# Patient Record
Sex: Female | Born: 1980 | Race: Black or African American | Hispanic: No | Smoking: Former smoker
Health system: Southern US, Community
[De-identification: ages and names within clinical notes are randomized; demographics above are authoritative.]

## PROBLEM LIST (undated history)

## (undated) ENCOUNTER — Encounter
Attending: Student in an Organized Health Care Education/Training Program | Primary: Student in an Organized Health Care Education/Training Program

## (undated) ENCOUNTER — Ambulatory Visit

## (undated) ENCOUNTER — Ambulatory Visit: Payer: BLUE CROSS/BLUE SHIELD

## (undated) ENCOUNTER — Encounter

## (undated) ENCOUNTER — Encounter: Attending: Obstetrics & Gynecology | Primary: Obstetrics & Gynecology

## (undated) ENCOUNTER — Telehealth

## (undated) ENCOUNTER — Encounter
Attending: Rehabilitative and Restorative Service Providers" | Primary: Rehabilitative and Restorative Service Providers"

## (undated) ENCOUNTER — Telehealth: Attending: Internal Medicine | Primary: Internal Medicine

## (undated) ENCOUNTER — Encounter: Attending: Advanced Practice Midwife | Primary: Advanced Practice Midwife

## (undated) ENCOUNTER — Encounter: Attending: Pulmonary Disease | Primary: Pulmonary Disease

## (undated) ENCOUNTER — Telehealth: Attending: Obstetrics & Gynecology | Primary: Obstetrics & Gynecology

## (undated) ENCOUNTER — Encounter: Payer: BLUE CROSS/BLUE SHIELD | Attending: Obstetrics & Gynecology | Primary: Obstetrics & Gynecology

## (undated) ENCOUNTER — Ambulatory Visit: Payer: PRIVATE HEALTH INSURANCE

## (undated) ENCOUNTER — Telehealth: Attending: Advanced Practice Midwife | Primary: Advanced Practice Midwife

## (undated) ENCOUNTER — Ambulatory Visit: Attending: Pharmacist | Primary: Pharmacist

## (undated) ENCOUNTER — Encounter
Payer: BLUE CROSS/BLUE SHIELD | Attending: Rehabilitative and Restorative Service Providers" | Primary: Rehabilitative and Restorative Service Providers"

## (undated) ENCOUNTER — Ambulatory Visit: Payer: BLUE CROSS/BLUE SHIELD | Attending: Advanced Practice Midwife | Primary: Advanced Practice Midwife

## (undated) ENCOUNTER — Non-Acute Institutional Stay: Payer: BLUE CROSS/BLUE SHIELD

## (undated) ENCOUNTER — Telehealth
Attending: Student in an Organized Health Care Education/Training Program | Primary: Student in an Organized Health Care Education/Training Program

## (undated) ENCOUNTER — Encounter: Payer: BLUE CROSS/BLUE SHIELD | Attending: Internal Medicine | Primary: Internal Medicine

## (undated) ENCOUNTER — Ambulatory Visit
Payer: PRIVATE HEALTH INSURANCE | Attending: Student in an Organized Health Care Education/Training Program | Primary: Student in an Organized Health Care Education/Training Program

## (undated) ENCOUNTER — Encounter: Payer: BLUE CROSS/BLUE SHIELD | Attending: Advanced Practice Midwife | Primary: Advanced Practice Midwife

## (undated) ENCOUNTER — Ambulatory Visit
Payer: BLUE CROSS/BLUE SHIELD | Attending: Rehabilitative and Restorative Service Providers" | Primary: Rehabilitative and Restorative Service Providers"

## (undated) ENCOUNTER — Telehealth: Attending: Pulmonary Disease | Primary: Pulmonary Disease

## (undated) ENCOUNTER — Inpatient Hospital Stay

## (undated) DIAGNOSIS — J45909 Unspecified asthma, uncomplicated: Secondary | ICD-10-CM

## (undated) HISTORY — PX: HERNIA REPAIR: SHX51

## (undated) MED ORDER — NUCALA 100 MG/ML SUBCUTANEOUS AUTO-INJECTOR: 100 mg | mL | 11 refills | 28 days

---

## 2011-02-22 ENCOUNTER — Emergency Department: Payer: Self-pay | Admitting: Emergency Medicine

## 2011-06-19 ENCOUNTER — Emergency Department: Payer: Self-pay | Admitting: Emergency Medicine

## 2017-07-02 ENCOUNTER — Other Ambulatory Visit: Payer: Self-pay

## 2017-07-02 ENCOUNTER — Emergency Department
Admission: EM | Admit: 2017-07-02 | Discharge: 2017-07-02 | Disposition: A | Discharge status: Home / Self Care | Attending: Emergency Medicine | Admitting: Emergency Medicine

## 2017-07-02 ENCOUNTER — Emergency Department

## 2017-07-02 DIAGNOSIS — R079 Chest pain, unspecified: Secondary | ICD-10-CM | POA: Diagnosis present

## 2017-07-02 DIAGNOSIS — F17200 Nicotine dependence, unspecified, uncomplicated: Secondary | ICD-10-CM | POA: Insufficient documentation

## 2017-07-02 DIAGNOSIS — J40 Bronchitis, not specified as acute or chronic: Secondary | ICD-10-CM | POA: Diagnosis not present

## 2017-07-02 LAB — BASIC METABOLIC PANEL
ANION GAP: 9 (ref 5–15)
BUN: 12 mg/dL (ref 6–20)
CHLORIDE: 103 mmol/L (ref 101–111)
CO2: 24 mmol/L (ref 22–32)
Calcium: 9.7 mg/dL (ref 8.9–10.3)
Creatinine, Ser: 0.66 mg/dL (ref 0.44–1.00)
GFR calc non Af Amer: >60 mL/min (ref >60)
GLUCOSE: 134 mg/dL — AB (ref 65–99)
Potassium: 3.2 mmol/L — ABNORMAL LOW (ref 3.5–5.1)
Sodium: 136 mmol/L (ref 135–145)

## 2017-07-02 LAB — CBC
HCT: 34.5 % — ABNORMAL LOW (ref 35.0–47.0)
HEMOGLOBIN: 11.2 g/dL — AB (ref 12.0–16.0)
MCH: 28.6 pg (ref 26.0–34.0)
MCHC: 32.5 g/dL (ref 32.0–36.0)
MCV: 87.9 fL (ref 80.0–100.0)
Platelets: 270 10*3/uL (ref 150–440)
RBC: 3.92 MIL/uL (ref 3.80–5.20)
RDW: 16.3 % — ABNORMAL HIGH (ref 11.5–14.5)
WBC: 8 10*3/uL (ref 3.6–11.0)

## 2017-07-02 LAB — TROPONIN I: Troponin I: <0.03 ng/mL (ref <0.03)

## 2017-07-02 LAB — FIBRIN DERIVATIVES D-DIMER (ARMC ONLY): Fibrin derivatives D-dimer (ARMC): 174.18 ng/mL (FEU) (ref 0.00–499.00)

## 2017-07-02 MED ORDER — IPRATROPIUM-ALBUTEROL 0.5-2.5 (3) MG/3ML IN SOLN
RESPIRATORY_TRACT | Status: AC
  Filled 2017-07-02: qty 3

## 2017-07-02 MED ORDER — PREDNISONE 20 MG PO TABS
60.0000 mg | ORAL_TABLET | Freq: Once | ORAL | Status: AC
  Administered 2017-07-02: 60 mg via ORAL
  Filled 2017-07-02: qty 3

## 2017-07-02 MED ORDER — PREDNISONE 10 MG (21) PO TBPK: ORAL_TABLET | Freq: Every day | ORAL | 0 refills | Status: DC

## 2017-07-02 MED ORDER — IPRATROPIUM-ALBUTEROL 0.5-2.5 (3) MG/3ML IN SOLN
3.0000 mL | Freq: Once | RESPIRATORY_TRACT | Status: AC
  Administered 2017-07-02: 3 mL via RESPIRATORY_TRACT

## 2017-07-02 MED ORDER — ALBUTEROL SULFATE HFA 108 (90 BASE) MCG/ACT IN AERS: 2.0000 | INHALATION_SPRAY | Freq: Four times a day (QID) | RESPIRATORY_TRACT | 2 refills | Status: AC | PRN

## 2017-07-02 MED ORDER — IPRATROPIUM-ALBUTEROL 0.5-2.5 (3) MG/3ML IN SOLN
3.0000 mL | Freq: Once | RESPIRATORY_TRACT | Status: AC
  Administered 2017-07-02: 3 mL via RESPIRATORY_TRACT
  Filled 2017-07-02: qty 3

## 2017-07-02 NOTE — ED Triage Notes (Signed)
Pt arrives to ED c/o central CP that began last night with SOB. Pt has inhaler and has been using it since last night. States feels wheezy and shaky. Alert, oriented, ambulatory. No distress noted.

## 2017-07-02 NOTE — ED Provider Notes (Signed)
[facility] Emergency Department Provider Note       Time seen: ----------------------------------------- 5:30 PM on 07/02/2017 -----------------------------------------   I have reviewed the triage vital signs and the nursing notes.  HISTORY   Chief Complaint Chest Pain    HPI Laurie Briggs is a 37 y.o. female with no significant past medical history who presents to the ED for sudden central chest pain that started last night with shortness of breath.  Patient states she cannot take a deep breath.  She has an inhaler and has been using it since last night.  She states she feels wheezing and shaky.  She is never had this happen before, nothing makes it better.  History reviewed. No pertinent past medical history.  There are no active problems to display for this patient.   Past Surgical History:  Procedure Laterality Date  . HERNIA REPAIR      Allergies Patient has no known allergies.  Social History Social History   Tobacco Use  . Smoking status: Current Some Day Smoker  Substance Use Topics  . Alcohol use: Yes  . Drug use: Not on file   Review of Systems Constitutional: Negative for fever. Cardiovascular: Positive for chest pain Respiratory: Positive for difficulty breathing and pain with breathing Gastrointestinal: Negative for abdominal pain, vomiting and diarrhea. Musculoskeletal: Negative for back pain. Skin: Negative for rash. Neurological: Negative for headaches, focal weakness or numbness.  All systems negative/normal/unremarkable except as stated in the HPI  ____________________________________________   PHYSICAL EXAM:  VITAL SIGNS: ED Triage Vitals  Enc Vitals Group     BP 07/02/17 1630 136/83     Pulse Rate 07/02/17 1630 (!) 122     Resp 07/02/17 1630 20     Temp 07/02/17 1630 98.3 F (36.8 C)     Temp Source 07/02/17 1630 Oral     SpO2 07/02/17 1630 95 %     Weight 07/02/17 1625 125 lb (56.7 kg)     Height  07/02/17 1625 5\' 2"  (1.575 m)     Head Circumference --      Peak Flow --      Pain Score 07/02/17 1625 7     Pain Loc --      Pain Edu? --      Excl. in GC? --    Constitutional: Alert and oriented. Well appearing and in no distress. Eyes: Conjunctivae are normal. Normal extraocular movements. ENT   Head: Normocephalic and atraumatic.   Nose: No congestion/rhinnorhea.   Mouth/Throat: Mucous membranes are moist.   Neck: No stridor. Cardiovascular: Rapid rate, regular rhythm. No murmurs, rubs, or gallops. Respiratory: Normal respiratory effort without tachypnea nor retractions.  Rhonchi bilaterally Gastrointestinal: Soft and nontender. Normal bowel sounds Musculoskeletal: Nontender with normal range of motion in extremities. No lower extremity tenderness nor edema. Neurologic:  Normal speech and language. No gross focal neurologic deficits are appreciated.  Skin:  Skin is warm, dry and intact. No rash noted. Psychiatric: Mood and affect are normal. Speech and behavior are normal.  ____________________________________________  EKG: Interpreted by me.  Sinus tachycardia with a rate of 121 bpm, normal PR interval, normal QRS, normal QT.  ____________________________________________  ED COURSE:  As part of my medical decision making, I reviewed the following data within the electronic MEDICAL RECORD NUMBER History obtained from family if available, nursing notes, old chart and ekg, as well as notes from prior ED visits. Patient presented for pleuritic chest pain and wheezing, we will assess with labs  and imaging as indicated at this time.   Procedures ____________________________________________   LABS (pertinent positives/negatives)  Labs Reviewed  BASIC METABOLIC PANEL - Abnormal; Notable for the following components:      Result Value   Potassium 3.2 (*)    Glucose, Bld 134 (*)    All other components within normal limits  CBC - Abnormal; Notable for the following  components:   Hemoglobin 11.2 (*)    HCT 34.5 (*)    RDW 16.3 (*)    All other components within normal limits  TROPONIN I  FIBRIN DERIVATIVES D-DIMER ([facility] ONLY)  POC URINE PREG, ED    RADIOLOGY Images were viewed by me  Chest x-ray reveals bronchitic changes  ____________________________________________  DIFFERENTIAL DIAGNOSIS   Bronchitis, pneumonia, PE, musculoskeletal pain, URI  FINAL ASSESSMENT AND PLAN  Bronchitis   Plan: The patient had presented for pleuritic chest pain and wheezing. Patient's labs did not reveal any acute process, d-dimer was negative. Patient's imaging was more suggestive of bronchitis than any other etiology.  She will be discharged with albuterol and steroids.  She is cleared for outpatient follow-up.   Ulice Dash, MD   Note: This note was generated in part or whole with voice recognition software. Voice recognition is usually quite accurate but there are transcription errors that can and very often do occur. I apologize for any typographical errors that were not detected and corrected.     Emily Filbert, MD 07/02/17 [phone]

## 2017-07-30 ENCOUNTER — Other Ambulatory Visit: Payer: Self-pay

## 2017-07-30 ENCOUNTER — Emergency Department

## 2017-07-30 ENCOUNTER — Encounter: Payer: Self-pay | Admitting: Emergency Medicine

## 2017-07-30 DIAGNOSIS — A5901 Trichomonal vulvovaginitis: Secondary | ICD-10-CM | POA: Diagnosis present

## 2017-07-30 DIAGNOSIS — D649 Anemia, unspecified: Secondary | ICD-10-CM | POA: Diagnosis not present

## 2017-07-30 DIAGNOSIS — F17200 Nicotine dependence, unspecified, uncomplicated: Secondary | ICD-10-CM | POA: Diagnosis not present

## 2017-07-30 DIAGNOSIS — N730 Acute parametritis and pelvic cellulitis: Secondary | ICD-10-CM | POA: Diagnosis not present

## 2017-07-30 DIAGNOSIS — N7093 Salpingitis and oophoritis, unspecified: Secondary | ICD-10-CM | POA: Diagnosis present

## 2017-07-30 DIAGNOSIS — R109 Unspecified abdominal pain: Secondary | ICD-10-CM | POA: Diagnosis present

## 2017-07-30 LAB — POCT PREGNANCY, URINE: Preg Test, Ur: NEGATIVE

## 2017-07-30 LAB — URINALYSIS, COMPLETE (UACMP) WITH MICROSCOPIC
BACTERIA UA: NONE SEEN
BILIRUBIN URINE: NEGATIVE
Glucose, UA: NEGATIVE mg/dL
KETONES UR: 5 mg/dL — AB
LEUKOCYTES UA: NEGATIVE
Nitrite: NEGATIVE
Protein, ur: NEGATIVE mg/dL
SPECIFIC GRAVITY, URINE: 1.009 (ref 1.005–1.030)
pH: 8 (ref 5.0–8.0)

## 2017-07-30 LAB — CBC
HEMATOCRIT: 29 % — AB (ref 35.0–47.0)
Hemoglobin: 10.2 g/dL — ABNORMAL LOW (ref 12.0–16.0)
MCH: 30.2 pg (ref 26.0–34.0)
MCHC: 35.1 g/dL (ref 32.0–36.0)
MCV: 86 fL (ref 80.0–100.0)
PLATELETS: 282 10*3/uL (ref 150–440)
RBC: 3.38 MIL/uL — ABNORMAL LOW (ref 3.80–5.20)
RDW: 17.9 % — ABNORMAL HIGH (ref 11.5–14.5)
WBC: 10.9 10*3/uL (ref 3.6–11.0)

## 2017-07-30 LAB — COMPREHENSIVE METABOLIC PANEL
ALBUMIN: 3.9 g/dL (ref 3.5–5.0)
ALT: 15 U/L (ref 0–44)
ANION GAP: 9 (ref 5–15)
AST: 20 U/L (ref 15–41)
Alkaline Phosphatase: 49 U/L (ref 38–126)
BILIRUBIN TOTAL: 1.2 mg/dL (ref 0.3–1.2)
BUN: 6 mg/dL (ref 6–20)
CO2: 24 mmol/L (ref 22–32)
Calcium: 9.2 mg/dL (ref 8.9–10.3)
Chloride: 102 mmol/L (ref 98–111)
Creatinine, Ser: 0.63 mg/dL (ref 0.44–1.00)
GFR calc Af Amer: >60 mL/min (ref >60)
GLUCOSE: 110 mg/dL — AB (ref 70–99)
POTASSIUM: 3.3 mmol/L — AB (ref 3.5–5.1)
Sodium: 135 mmol/L (ref 135–145)
Total Protein: 8.2 g/dL — ABNORMAL HIGH (ref 6.5–8.1)

## 2017-07-30 LAB — LIPASE, BLOOD: Lipase: 23 U/L (ref 11–51)

## 2017-07-30 MED ORDER — SODIUM CHLORIDE 0.9 % IV BOLUS
1000.0000 mL | Freq: Once | INTRAVENOUS | Status: AC
  Administered 2017-07-30: 1000 mL via INTRAVENOUS

## 2017-07-30 MED ORDER — ONDANSETRON HCL 4 MG/2ML IJ SOLN
4.0000 mg | Freq: Once | INTRAMUSCULAR | Status: AC | PRN
  Administered 2017-07-30: 4 mg via INTRAVENOUS
  Filled 2017-07-30: qty 2

## 2017-07-30 MED ORDER — FENTANYL CITRATE (PF) 100 MCG/2ML IJ SOLN
50.0000 ug | INTRAMUSCULAR | Status: AC | PRN
  Administered 2017-07-30 – 2017-07-31 (×2): 50 ug via INTRAVENOUS
  Filled 2017-07-30 (×2): qty 2

## 2017-07-30 NOTE — ED Triage Notes (Signed)
Pt arrives ambulatory to ED with c/o abdominal pain x 2 days which radiates across her abdomen and into her back. Pt reports that she hurts more on the left side then the right at this time. Pt is in NAD.

## 2017-07-30 NOTE — ED Notes (Signed)
Returned from ultrasound.

## 2017-07-30 NOTE — ED Notes (Signed)
IV attempt x 1 by paramedic student Cheree DittoGraham without success.

## 2017-07-31 ENCOUNTER — Emergency Department

## 2017-07-31 ENCOUNTER — Inpatient Hospital Stay
Admission: EM | Admit: 2017-07-31 | Discharge: 2017-08-02 | DRG: 759 | Disposition: A | Discharge status: Home / Self Care | Attending: Obstetrics and Gynecology | Admitting: Obstetrics and Gynecology

## 2017-07-31 ENCOUNTER — Encounter: Payer: Self-pay | Admitting: Radiology

## 2017-07-31 DIAGNOSIS — F17200 Nicotine dependence, unspecified, uncomplicated: Secondary | ICD-10-CM | POA: Diagnosis present

## 2017-07-31 DIAGNOSIS — R110 Nausea: Secondary | ICD-10-CM

## 2017-07-31 DIAGNOSIS — R102 Pelvic and perineal pain: Secondary | ICD-10-CM

## 2017-07-31 DIAGNOSIS — N7093 Salpingitis and oophoritis, unspecified: Secondary | ICD-10-CM | POA: Diagnosis present

## 2017-07-31 DIAGNOSIS — R109 Unspecified abdominal pain: Secondary | ICD-10-CM | POA: Diagnosis present

## 2017-07-31 DIAGNOSIS — N730 Acute parametritis and pelvic cellulitis: Secondary | ICD-10-CM

## 2017-07-31 DIAGNOSIS — R198 Other specified symptoms and signs involving the digestive system and abdomen: Secondary | ICD-10-CM

## 2017-07-31 DIAGNOSIS — D649 Anemia, unspecified: Secondary | ICD-10-CM | POA: Diagnosis present

## 2017-07-31 DIAGNOSIS — A5901 Trichomonal vulvovaginitis: Secondary | ICD-10-CM | POA: Diagnosis present

## 2017-07-31 LAB — WET PREP, GENITAL: Yeast Wet Prep HPF POC: NONE SEEN

## 2017-07-31 LAB — CHLAMYDIA/NGC RT PCR (ARMC ONLY)
CHLAMYDIA TR: NOT DETECTED
N GONORRHOEAE: NOT DETECTED

## 2017-07-31 MED ORDER — MORPHINE SULFATE (PF) 2 MG/ML IV SOLN
1.0000 mg | INTRAVENOUS | Status: DC | PRN
Start: 2017-07-31 — End: 2017-08-02
  Administered 2017-07-31: 2 mg via INTRAVENOUS
  Filled 2017-07-31: qty 1

## 2017-07-31 MED ORDER — METRONIDAZOLE 500 MG PO TABS
500.0000 mg | ORAL_TABLET | Freq: Once | ORAL | Status: AC
  Administered 2017-07-31: 500 mg via ORAL
  Filled 2017-07-31: qty 1

## 2017-07-31 MED ORDER — MORPHINE SULFATE (PF) 2 MG/ML IV SOLN
2.0000 mg | Freq: Once | INTRAVENOUS | Status: DC
  Filled 2017-07-31: qty 1

## 2017-07-31 MED ORDER — IBUPROFEN 800 MG PO TABS
800.0000 mg | ORAL_TABLET | Freq: Three times a day (TID) | ORAL | Status: DC | PRN
  Administered 2017-07-31 – 2017-08-01 (×2): 800 mg via ORAL
  Filled 2017-07-31 (×2): qty 1

## 2017-07-31 MED ORDER — OXYCODONE-ACETAMINOPHEN 5-325 MG PO TABS
1.0000 | ORAL_TABLET | ORAL | Status: DC | PRN
  Administered 2017-07-31 – 2017-08-02 (×7): 1 via ORAL
  Filled 2017-07-31 (×7): qty 1

## 2017-07-31 MED ORDER — ONDANSETRON HCL 4 MG/2ML IJ SOLN
4.0000 mg | Freq: Four times a day (QID) | INTRAMUSCULAR | Status: DC | PRN
  Administered 2017-07-31 – 2017-08-01 (×2): 4 mg via INTRAVENOUS
  Filled 2017-07-31 (×2): qty 2

## 2017-07-31 MED ORDER — MORPHINE SULFATE (PF) 4 MG/ML IV SOLN
4.0000 mg | Freq: Once | INTRAVENOUS | Status: AC
  Administered 2017-07-31: 4 mg via INTRAVENOUS
  Filled 2017-07-31: qty 1

## 2017-07-31 MED ORDER — ONDANSETRON HCL 4 MG PO TABS: 4.0000 mg | ORAL_TABLET | Freq: Four times a day (QID) | ORAL | Status: DC | PRN

## 2017-07-31 MED ORDER — MORPHINE SULFATE (PF) 4 MG/ML IV SOLN
4.0000 mg | Freq: Once | INTRAVENOUS | Status: AC
  Administered 2017-07-31: 4 mg via INTRAVENOUS

## 2017-07-31 MED ORDER — SODIUM CHLORIDE 0.9 % IV SOLN
2.0000 g | Freq: Two times a day (BID) | INTRAVENOUS | Status: DC
  Administered 2017-07-31 – 2017-08-02 (×5): 2 g via INTRAVENOUS
  Filled 2017-07-31 (×7): qty 2

## 2017-07-31 MED ORDER — ONDANSETRON HCL 4 MG/2ML IJ SOLN
4.0000 mg | Freq: Once | INTRAMUSCULAR | Status: AC
  Administered 2017-07-31: 4 mg via INTRAVENOUS

## 2017-07-31 MED ORDER — DOXYCYCLINE HYCLATE 100 MG PO TABS
100.0000 mg | ORAL_TABLET | Freq: Two times a day (BID) | ORAL | Status: DC
  Administered 2017-07-31 – 2017-08-01 (×3): 100 mg via ORAL
  Filled 2017-07-31 (×5): qty 1

## 2017-07-31 MED ORDER — PRENATAL MULTIVITAMIN CH: 1.0000 | ORAL_TABLET | Freq: Every day | ORAL | Status: DC

## 2017-07-31 MED ORDER — SODIUM CHLORIDE 0.9 % IV SOLN: 100.0000 mg | Freq: Once | INTRAVENOUS | Status: DC

## 2017-07-31 MED ORDER — IOHEXOL 300 MG/ML  SOLN
75.0000 mL | Freq: Once | INTRAMUSCULAR | Status: AC | PRN
  Administered 2017-07-31: 75 mL via INTRAVENOUS

## 2017-07-31 MED ORDER — MORPHINE SULFATE (PF) 2 MG/ML IV SOLN
2.0000 mg | Freq: Once | INTRAVENOUS | Status: AC
  Administered 2017-07-31: 2 mg via INTRAVENOUS

## 2017-07-31 MED ORDER — MORPHINE SULFATE (PF) 4 MG/ML IV SOLN
INTRAVENOUS | Status: AC
  Administered 2017-07-31: 4 mg via INTRAVENOUS
  Filled 2017-07-31: qty 1

## 2017-07-31 MED ORDER — MORPHINE SULFATE (PF) 2 MG/ML IV SOLN
INTRAVENOUS | Status: AC
  Filled 2017-07-31: qty 1

## 2017-07-31 MED ORDER — DEXTROSE-NACL 5-0.45 % IV SOLN
INTRAVENOUS | Status: DC
  Administered 2017-07-31 – 2017-08-01 (×3): via INTRAVENOUS

## 2017-07-31 MED ORDER — ONDANSETRON HCL 4 MG/2ML IJ SOLN
4.0000 mg | Freq: Once | INTRAMUSCULAR | Status: DC
  Filled 2017-07-31: qty 2

## 2017-07-31 MED ORDER — SODIUM CHLORIDE 0.9 % IV SOLN: 2.0000 g | Freq: Two times a day (BID) | INTRAVENOUS | Status: DC

## 2017-07-31 NOTE — ED Notes (Signed)
ED Provider at bedside with [facility] from [facility], EDT for pelvic exam.

## 2017-07-31 NOTE — ED Notes (Signed)
ED Provider at bedside. 

## 2017-07-31 NOTE — ED Notes (Signed)
Notified pharmacy of need for Cefotan and Vibramycin IV medication

## 2017-07-31 NOTE — ED Notes (Signed)
Patient transported to CT 

## 2017-07-31 NOTE — ED Notes (Signed)
Pt waiting patiently in sub wait recliner with husband at her side;

## 2017-07-31 NOTE — ED Notes (Signed)
[name] @ US says patient is having a hard time with pain during her TV US, received VORB for 4mg  IV Morphine from Manson PasseyBrown, MD.

## 2017-07-31 NOTE — H&P (Addendum)
Obstetrics & Gynecology Consult H&P   Consulting Department: Emergency Department  Consulting Physician: Marjean Donna, MD  Consulting Question: TOA   History of Present Illness: Patient is a 37 y.o. G1P1001 presenting with abdominal pain and nausea.  Pain initially started 07/28/2017 and then progressively worsened.  She began experiencing fevers and chills over the weekend.  Was unable to go to work and presented to ED for evaluation.  Evaluation in the emergency department revealed bilateral TOA's.  GC/CT culture was negative.  She does not have a prior history of PID/GC/CT.  Denies vaginal discharge or abnormal uterine bleeding.  She is currently not on any contraception and reports LMP was in the past week.  Pain is 10/10, bilateral lower pelvis and radiating into lower back.  Last pregnancy 19 year ago and does not desire future fertility.    Review of Systems:10 point review of systems  Past Medical History:  History reviewed. No pertinent past medical history.  Past Surgical History:  Past Surgical History:  Procedure Laterality Date  . HERNIA REPAIR     Family History:  No family history on file.  Social History:  Social History   Socioeconomic History  . Marital status: Legally Separated    Spouse name: Not on file  . Number of children: Not on file  . Years of education: Not on file  . Highest education level: Not on file  Occupational History  . Not on file  Social Needs  . Financial resource strain: Not on file  . Food insecurity:    Worry: Not on file    Inability: Not on file  . Transportation needs:    Medical: Not on file    Non-medical: Not on file  Tobacco Use  . Smoking status: Current Some Day Smoker  . Smokeless tobacco: Never Used  Substance and Sexual Activity  . Alcohol use: Yes  . Drug use: Never  . Sexual activity: Not on file  Lifestyle  . Physical activity:    Days per week: Not on file    Minutes per session: Not on file  .  Stress: Not on file  Relationships  . Social connections:    Talks on phone: Not on file    Gets together: Not on file    Attends religious service: Not on file    Active member of club or organization: Not on file    Attends meetings of clubs or organizations: Not on file    Relationship status: Not on file  . Intimate partner violence:    Fear of current or ex partner: Not on file    Emotionally abused: Not on file    Physically abused: Not on file    Forced sexual activity: Not on file  Other Topics Concern  . Not on file  Social History Narrative  . Not on file    Allergies:  No Known Allergies  Medications: Prior to Admission medications   Medication Sig Start Date End Date Taking? Authorizing Provider  albuterol (PROVENTIL HFA;VENTOLIN HFA) 108 (90 Base) MCG/ACT inhaler Inhale 2 puffs into the lungs every 6 (six) hours as needed for wheezing or shortness of breath. 07/02/17  Yes Earleen Newport, MD  predniSONE (STERAPRED UNI-PAK 21 TAB) 10 MG (21) TBPK tablet Take by mouth daily. Dispense steroid taper pack as directed Patient not taking: Reported on 07/31/2017 07/02/17   Earleen Newport, MD    Physical Exam Vitals: Blood pressure 110/60, pulse 100, temperature 99.6 F (37.6  C), temperature source Oral, resp. rate (!) 22, height 5' 2"  (1.575 m), weight 125 lb (56.7 kg), last menstrual period 07/24/2017, SpO2 98 %. General: NAD HEENT: normocephalic, anicteric Pulmonary: No increased work of breathing Cardiovascular: RRR, distal pulses 2+ Abdomen: diffusely tender, non-distended, + rebound, + guarding Genitourinary: deferred Extremities: no edema, erythema, or tenderness Neurologic: Grossly intact Psychiatric: mood appropriate, affect full  Labs: Results for orders placed or performed during the hospital encounter of 07/31/17 (from the past 72 hour(s))  Lipase, blood     Status: None   Collection Time: 07/30/17  7:56 PM  Result Value Ref Range   Lipase 23  11 - 51 U/L    Comment: Performed at [facility], [address] [place], [place] [address]  Comprehensive metabolic panel     Status: Abnormal   Collection Time: 07/30/17  7:56 PM  Result Value Ref Range   Sodium 135 135 - 145 mmol/L   Potassium 3.3 (L) 3.5 - 5.1 mmol/L   Chloride 102 98 - 111 mmol/L    Comment: Please note change in reference range.   CO2 24 22 - 32 mmol/L   Glucose, Bld 110 (H) 70 - 99 mg/dL    Comment: Please note change in reference range.   BUN 6 6 - 20 mg/dL    Comment: Please note change in reference range.   Creatinine, Ser 0.63 0.44 - 1.00 mg/dL   Calcium 9.2 8.9 - 10.3 mg/dL   Total Protein 8.2 (H) 6.5 - 8.1 g/dL   Albumin 3.9 3.5 - 5.0 g/dL   AST 20 15 - 41 U/L   ALT 15 0 - 44 U/L    Comment: Please note change in reference range.   Alkaline Phosphatase 49 38 - 126 U/L   Total Bilirubin 1.2 0.3 - 1.2 mg/dL   GFR calc non Af Amer >60 >60 mL/min   GFR calc Af Amer >60 >60 mL/min    Comment: (NOTE) The eGFR has been calculated using the CKD EPI equation. This calculation has not been validated in all clinical situations. eGFR's persistently <60 mL/min signify possible Chronic Kidney Disease.    Anion gap 9 5 - 15    Comment: Performed at [facility], [address] [place], [place] [address]  CBC     Status: Abnormal   Collection Time: 07/30/17  7:56 PM  Result Value Ref Range   WBC 10.9 3.6 - 11.0 K/uL   RBC 3.38 (L) 3.80 - 5.20 MIL/uL   Hemoglobin 10.2 (L) 12.0 - 16.0 g/dL   HCT 29.0 (L) 35.0 - 47.0 %   MCV 86.0 80.0 - 100.0 fL   MCH 30.2 26.0 - 34.0 pg   MCHC 35.1 32.0 - 36.0 g/dL   RDW 17.9 (H) 11.5 - 14.5 %   Platelets 282 150 - 440 K/uL    Comment: Performed at [facility], [address] [place],  [address]  Urinalysis, Complete w Microscopic     Status: Abnormal   Collection Time: 07/30/17  7:56 PM  Result Value Ref Range   Color, Urine STRAW (A) YELLOW   APPearance CLEAR (A) CLEAR    Specific Gravity, Urine 1.009 1.005 - 1.030   pH 8.0 5.0 - 8.0   Glucose, UA NEGATIVE NEGATIVE mg/dL   Hgb urine dipstick MODERATE (A) NEGATIVE   Bilirubin Urine NEGATIVE NEGATIVE   Ketones, ur 5 (A) NEGATIVE mg/dL   Protein, ur NEGATIVE NEGATIVE mg/dL   Nitrite NEGATIVE NEGATIVE  Leukocytes, UA NEGATIVE NEGATIVE   RBC / HPF 6-10 0 - 5 RBC/hpf   WBC, UA 0-5 0 - 5 WBC/hpf   Bacteria, UA NONE SEEN NONE SEEN   Squamous Epithelial / LPF 0-5 0 - 5   Mucus PRESENT     Comment: Performed at [facility], [address] [place], [place] [address]  Pregnancy, urine POC     Status: None   Collection Time: 07/30/17  8:03 PM  Result Value Ref Range   Preg Test, Ur NEGATIVE NEGATIVE    Comment:        THE SENSITIVITY OF THIS METHODOLOGY IS >24 mIU/mL   Chlamydia/NGC rt PCR ([facility] only)     Status: None   Collection Time: 07/31/17  5:52 AM  Result Value Ref Range   Specimen source GC/Chlam ENDOCERVICAL    Chlamydia Tr NOT DETECTED NOT DETECTED   N gonorrhoeae NOT DETECTED NOT DETECTED    Comment: (NOTE) 100  This methodology has not been evaluated in pregnant women or in 200  patients with a history of hysterectomy. 300 400  This methodology will not be performed on patients less than 38  years of age. Performed at [facility], [address] [place], [place] [address]   Wet prep, genital     Status: Abnormal   Collection Time: 07/31/17  5:52 AM  Result Value Ref Range   Yeast Wet Prep HPF POC NONE SEEN NONE SEEN   Trich, Wet Prep PRESENT (A) NONE SEEN   Clue Cells Wet Prep HPF POC PRESENT (A) NONE SEEN   WBC, Wet Prep HPF POC MANY (A) NONE SEEN   Sperm PRESENT     Comment: Performed at [facility], [address] [place], [place] [address]    Imaging Dg Chest 2 View  Result Date: 07/02/2017 CLINICAL DATA:  Chest pain with shortness of breath. Current smoker. EXAM: CHEST - 2 VIEW COMPARISON:  None. FINDINGS: Heart size and mediastinal contours  are within normal limits. Bronchitic changes noted centrally. No confluent opacity to suggest a developing pneumonia. No pleural effusion or pneumothorax seen. Osseous structures about the chest are unremarkable. IMPRESSION: 1. Bronchitic changes, of uncertain chronicity. 2. No evidence of consolidating pneumonia or pulmonary edema. Electronically Signed   By: Franki Cabot M.D.   On: 07/02/2017 17:15   US Pelvis Transvanginal Non-ob (tv Only)  Result Date: 07/31/2017 CLINICAL DATA:  Pelvic pain EXAM: TRANSABDOMINAL AND TRANSVAGINAL ULTRASOUND OF PELVIS TECHNIQUE: Study was performed transabdominally to optimize pelvic field of view evaluation and transvaginally to optimize internal visceral architecture evaluation. COMPARISON:  CT abdomen and pelvis July 31, 2017 FINDINGS: Uterus Measurements: 9.1 x 4.4 x 4.8 cm. There is a focal hypoechoic mass along the leftward aspect of the superior uterine fundus measuring 2.7 x 2.7 x 2.6 cm, felt to be indicative of a leiomyoma. No other focal mass is seen within the myometrial region. Endometrium Thickness: 11 mm. There is an echogenic mass within the endometrium measuring 0.8 x 0.5 x 0.9 cm which does show blood flow consistent with an endometrial polyp. Right ovary Measurements: 2.3 x 2.1 x 2.3 cm. There is an elongated tubular structure arising from the right adnexa extending to the midline. This elongated tubular structure measures 3.7 x 2.1 x 2.1 cm cm. Left ovary Measurements: 2.9 x 2.4 x 2.7 cm. There is an elongated tubular structure on the left which extends to the midline measuring 4.7 x 2.5 x 2.5 cm. Other findings Mild  free pelvic fluid. IMPRESSION: 1. Tubular structures arising from each adnexa which extend respectively to the midline as noted. Suspect bilateral tubo-ovarian abscesses. 2. Apparent endometrial polyp measuring 0.8 x 0.5 x 0.9 cm. This finding may warrant uterine sonohysterography to further evaluate. 3. Leiomyoma arising from the leftward  aspect of the superior uterine fundus measuring 2.7 x 2.7 x 2.6 cm. 4.  Small amount of free pelvic fluid. Electronically Signed   By: Lowella Grip III M.D.   On: 07/31/2017 07:11   US Pelvis Complete  Result Date: 07/31/2017 CLINICAL DATA:  Pelvic pain EXAM: TRANSABDOMINAL AND TRANSVAGINAL ULTRASOUND OF PELVIS TECHNIQUE: Study was performed transabdominally to optimize pelvic field of view evaluation and transvaginally to optimize internal visceral architecture evaluation. COMPARISON:  CT abdomen and pelvis July 31, 2017 FINDINGS: Uterus Measurements: 9.1 x 4.4 x 4.8 cm. There is a focal hypoechoic mass along the leftward aspect of the superior uterine fundus measuring 2.7 x 2.7 x 2.6 cm, felt to be indicative of a leiomyoma. No other focal mass is seen within the myometrial region. Endometrium Thickness: 11 mm. There is an echogenic mass within the endometrium measuring 0.8 x 0.5 x 0.9 cm which does show blood flow consistent with an endometrial polyp. Right ovary Measurements: 2.3 x 2.1 x 2.3 cm. There is an elongated tubular structure arising from the right adnexa extending to the midline. This elongated tubular structure measures 3.7 x 2.1 x 2.1 cm cm. Left ovary Measurements: 2.9 x 2.4 x 2.7 cm. There is an elongated tubular structure on the left which extends to the midline measuring 4.7 x 2.5 x 2.5 cm. Other findings Mild free pelvic fluid. IMPRESSION: 1. Tubular structures arising from each adnexa which extend respectively to the midline as noted. Suspect bilateral tubo-ovarian abscesses. 2. Apparent endometrial polyp measuring 0.8 x 0.5 x 0.9 cm. This finding may warrant uterine sonohysterography to further evaluate. 3. Leiomyoma arising from the leftward aspect of the superior uterine fundus measuring 2.7 x 2.7 x 2.6 cm. 4.  Small amount of free pelvic fluid. Electronically Signed   By: Lowella Grip III M.D.   On: 07/31/2017 07:11   Ct Abdomen Pelvis W Contrast  Result Date:  07/31/2017 CLINICAL DATA:  37 year old female with abdominal pain. EXAM: CT ABDOMEN AND PELVIS WITH CONTRAST TECHNIQUE: Multidetector CT imaging of the abdomen and pelvis was performed using the standard protocol following bolus administration of intravenous contrast. CONTRAST:  74m OMNIPAQUE IOHEXOL 300 MG/ML  SOLN COMPARISON:  Right upper quadrant ultrasound dated 07/30/2017 FINDINGS: Lower chest: The visualized lung bases are clear. No intra-abdominal free air.  Small free fluid within the pelvis. Hepatobiliary: There is an 11 mm hypodense lesion in the left lobe of the liver anteriorly (series 2, image 22) with apparent focus of enhancement. This lesion is not well characterized but may represent a hemangioma as seen on the prior ultrasound. MRI may provide better characterisation. The liver lesions are better seen on the ultrasound. No intrahepatic biliary ductal dilatation. The gallbladder is unremarkable. Pancreas: Unremarkable. No pancreatic ductal dilatation or surrounding inflammatory changes. Spleen: Normal in size without focal abnormality. Adrenals/Urinary Tract: The adrenal glands are unremarkable. There is no hydronephrosis on either side. Minimal fullness of the right renal collecting system likely reactive to inflammatory changes of the pelvis. The urinary bladder is unremarkable. Stomach/Bowel: There is no bowel obstruction. Segmental thickening of the sigmoid colon likely reactive to inflammatory changes of the adnexa. The appendix contains fecal debris otherwise unremarkable. Vascular/Lymphatic: The abdominal aorta  and IVC appear unremarkable. No portal venous gas. Multiple top-normal retroperitoneal lymph nodes, reactive. Reproductive: The uterus is anteverted. There is a 2.5 cm anterior fundal fibroid. Dilated and fluid-filled tubular structures in the region of the adnexa bilaterally with mild adjacent inflammatory changes most likely represent pyosalpinx related to pelvic inflammatory  disease. Cystic structures in the region of the ovaries measure 3 cm on the right may represent tubo-ovarian abscess. Correlation with pelvic ultrasound recommended. Other: None Musculoskeletal: No acute or significant osseous findings. IMPRESSION: 1. Findings most consistent with pelvic inflammatory disease/TOA. Correlation with clinical exam and further evaluation with pelvic ultrasound recommended. 2. Thickened segment of sigmoid colon, likely reactive to inflammatory changes of the adnexa. 3. An 11 mm hepatic lesion, incompletely characterized, possibly hemangioma. The liver lesions are better seen on the earlier ultrasound. Electronically Signed   By: Anner Crete M.D.   On: 07/31/2017 05:14   US Abdomen Limited Ruq  Result Date: 07/30/2017 CLINICAL DATA:  Abdominal pain for 2 days. EXAM: ULTRASOUND ABDOMEN LIMITED RIGHT UPPER QUADRANT COMPARISON:  None. FINDINGS: Gallbladder: No gallstones or wall thickening visualized. No sonographic Murphy sign noted by sonographer. Common bile duct: Diameter: Normal, 2 mm. Liver: Multiple hyperechoic liver lesions. Examples in the left hepatic lobe at 1.6 x 1.3 x 1.2 cm and in the right hepatic lobe at 1.0 x 0.7 x 0.6 cm. Portal vein is patent on color Doppler imaging with normal direction of blood flow towards the liver. IMPRESSION: No acute process or explanation for abdominal pain. Multiple hyperechoic liver lesions. Most likely hemangiomas. If any history of primary malignancy or liver disease, recommend nonemergent outpatient abdominal MRI. If no such history, potential clinical strategies include ultrasound follow-up at 3-6 months or further characterization with MRI. Electronically Signed   By: Abigail Miyamoto M.D.   On: 07/30/2017 21:17    Assessment: 38 y.o. Marland [facility]ad[facility]  Plan: 1) TOA Based on the Sexually Transmitted Diseases Treatment Guidelines, 2010 (MMWR Recomm Rep 2010;59 and Updated by the [facility] after Expert panel meeting on April 30-May 19, 2011) the CDC diagnostic criteria for PID are as follows: One or more of the following minimum clinical criteria  1) Cervical motion tenderness (Per ER physician, exam not repeated)  2) Uterine tenderness  3) Adnexal tenderness Specificity can be increased if any one of the following additional criteria are present  1) Oral temperatue >101F (38.3C)  2) Abnormal cervical mucopurulent discharge or cervical friability  3) Presence of abundent WBC on wet prep  4) Elevated ESR  5) Elevated CRP  6) Laboratory evidence of N. Gonorrhea or C. Trachomatis Most Specific criteria for PID   1) Endometrial biopsy showing evidence of endometritis  2) Transvaginal ultrasound or MRI showing evidence of tubo-ovarian abscess  3) Findings consistent with PID on laparoscopy  - Cefotetan 2g IV every 12hrs plus Doxycyline 167m IV or po every 12-hrs - percocet and ibuprofen for pain control - morphine for breakthrough pain or po intolerance  2) FEN - D5 1/2 NS at 1265mhr, general diet  3) Activity - as tolerated  4) DVT ppx - ambulatory  5) Disposition - pending clinical improvement  AnMalachy MoodMD, FABayshore GardensCoHackberryroup 07/31/2017, 9:46 AM

## 2017-07-31 NOTE — ED Notes (Signed)
Patient still @ US.

## 2017-07-31 NOTE — ED Provider Notes (Signed)
[facility] Emergency Department Provider Note _   First MD Initiated Contact with Patient 07/31/17 [phone]     (approximate)  I have reviewed the triage vital signs and the nursing notes.   HISTORY  Chief Complaint Abdominal Pain   HPI Laurie Briggs is a 37 y.o. female presents to the emergency department with a 2-day history of bilateral lower quadrant/pelvic pain with radiation to her back.  Patient denies any nausea vomiting or diarrhea.  Patient states pain is worse with ambulation.  Patient denies any vaginal discharge.  Patient denies any dysuria.  Patient was unaware of fever until arrival to the emergency department.   Past medical history None Patient Active Problem List   Diagnosis Date Noted  . TOA (tubo-ovarian abscess) 07/31/2017    Past Surgical History:  Procedure Laterality Date  . HERNIA REPAIR      Prior to Admission medications   Medication Sig Start Date End Date Taking? Authorizing Provider  albuterol (PROVENTIL HFA;VENTOLIN HFA) 108 (90 Base) MCG/ACT inhaler Inhale 2 puffs into the lungs every 6 (six) hours as needed for wheezing or shortness of breath. 07/02/17  Yes Emily Filbert, MD  predniSONE (STERAPRED UNI-PAK 21 TAB) 10 MG (21) TBPK tablet Take by mouth daily. Dispense steroid taper pack as directed Patient not taking: Reported on 07/31/2017 07/02/17   Emily Filbert, MD    Allergies Patient has no known allergies.  No family history on file.  Social History Social History   Tobacco Use  . Smoking status: Current Some Day Smoker  . Smokeless tobacco: Never Used  Substance Use Topics  . Alcohol use: Yes  . Drug use: Never    Review of Systems Constitutional: No fever/chills Eyes: No visual changes. ENT: No sore throat. Cardiovascular: Denies chest pain. Respiratory: Denies shortness of breath. Gastrointestinal: No abdominal pain.  No nausea, no vomiting.  No diarrhea.  No  constipation. Genitourinary: Negative for dysuria.  Positive for pelvic pain Musculoskeletal: Negative for neck pain.  Negative for back pain. Integumentary: Negative for rash. Neurological: Negative for headaches, focal weakness or numbness.   ____________________________________________   PHYSICAL EXAM:  VITAL SIGNS: ED Triage Vitals  Enc Vitals Group     BP 07/30/17 1943 117/76     Pulse Rate 07/30/17 1943 (!) 116     Resp 07/30/17 1943 (!) 22     Temp 07/30/17 1943 (!) 101.3 F (38.5 C)     Temp Source 07/30/17 1943 Oral     SpO2 07/30/17 1943 100 %     Weight 07/30/17 1944 56.7 kg (125 lb)     Height 07/30/17 1944 1.575 m (5\' 2" )     Head Circumference --      Peak Flow --      Pain Score 07/30/17 1943 9     Pain Loc --      Pain Edu? --      Excl. in GC? --     Constitutional: Alert and oriented. Well appearing and in no acute distress. Eyes: Conjunctivae are normal.  Head: Atraumatic. Mouth/Throat: Mucous membranes are moist.  Oropharynx non-erythematous. Neck: No stridor.   Cardiovascular: Normal rate, regular rhythm. Good peripheral circulation. Grossly normal heart sounds. Respiratory: Normal respiratory effort.  No retractions. Lungs CTAB. Gastrointestinal: Pain with right lower quadrant left lower quadrant and suprapubic palpation.  No distention.  Genitourinary: Copious purulent appearing vaginal discharge Musculoskeletal: No lower extremity tenderness nor edema. No gross deformities of extremities. Neurologic:  Normal  speech and language. No gross focal neurologic deficits are appreciated.  Skin:  Skin is warm, dry and intact. No rash noted. Psychiatric: Mood and affect are normal. Speech and behavior are normal.  ____________________________________________   LABS (all labs ordered are listed, but only abnormal results are displayed)  Labs Reviewed  WET PREP, GENITAL - Abnormal; Notable for the following components:      Result Value   Trich, Wet  Prep PRESENT (*)    Clue Cells Wet Prep HPF POC PRESENT (*)    WBC, Wet Prep HPF POC MANY (*)    All other components within normal limits  COMPREHENSIVE METABOLIC PANEL - Abnormal; Notable for the following components:   Potassium 3.3 (*)    Glucose, Bld 110 (*)    Total Protein 8.2 (*)    All other components within normal limits  CBC - Abnormal; Notable for the following components:   RBC 3.38 (*)    Hemoglobin 10.2 (*)    HCT 29.0 (*)    RDW 17.9 (*)    All other components within normal limits  URINALYSIS, COMPLETE (UACMP) WITH MICROSCOPIC - Abnormal; Notable for the following components:   Color, Urine STRAW (*)    APPearance CLEAR (*)    Hgb urine dipstick MODERATE (*)    Ketones, ur 5 (*)    All other components within normal limits  CHLAMYDIA/NGC RT PCR ([facility] ONLY)  LIPASE, BLOOD  RPR  HIV ANTIBODY (ROUTINE TESTING)  POC URINE PREG, ED  POCT PREGNANCY, URINE   ____________________________________________  EKG  ED ECG REPORT I, [place], the attending physician, personally viewed and interpreted this ECG.   Date: 07/31/2017  EKG Time: 7:45 PM  Rate: 109  Rhythm: Sinus tachycardia   Axis: Normal  Intervals: Normal  ST&T Change: None  ____________________________________________  RADIOLOGY I, [place] N [place], personally viewed and evaluated these images (plain radiographs) as part of my medical decision making, as well as reviewing the written report by the radiologist.  ED MD interpretation: CT scan of the abdomen pelvis concerning for pyosalpinx and possible tubo-ovarian abscess per radiologist.  Pelvic ultrasound revealed bilateral tubo-ovarian abscess     Official radiology report(s): [place] Pelvis Transvanginal Non-ob (tv Only)  Result Date: 07/31/2017 CLINICAL DATA:  Pelvic pain EXAM: TRANSABDOMINAL AND TRANSVAGINAL ULTRASOUND OF PELVIS TECHNIQUE: Study was performed transabdominally to optimize pelvic field of view evaluation and  transvaginally to optimize internal visceral architecture evaluation. COMPARISON:  CT abdomen and pelvis July 31, 2017 FINDINGS: Uterus Measurements: 9.1 x 4.4 x 4.8 cm. There is a focal hypoechoic mass along the leftward aspect of the superior uterine fundus measuring 2.7 x 2.7 x 2.6 cm, felt to be indicative of a leiomyoma. No other focal mass is seen within the myometrial region. Endometrium Thickness: 11 mm. There is an echogenic mass within the endometrium measuring 0.8 x 0.5 x 0.9 cm which does show blood flow consistent with an endometrial polyp. Right ovary Measurements: 2.3 x 2.1 x 2.3 cm. There is an elongated tubular structure arising from the right adnexa extending to the midline. This elongated tubular structure measures 3.7 x 2.1 x 2.1 cm cm. Left ovary Measurements: 2.9 x 2.4 x 2.7 cm. There is an elongated tubular structure on the left which extends to the midline measuring 4.7 x 2.5 x 2.5 cm. Other findings Mild free pelvic fluid. IMPRESSION: 1. Tubular structures arising from each adnexa which extend respectively to the midline as noted. Suspect bilateral tubo-ovarian abscesses. 2. Apparent  endometrial polyp measuring 0.8 x 0.5 x 0.9 cm. This finding may warrant uterine sonohysterography to further evaluate. 3. Leiomyoma arising from the leftward aspect of the superior uterine fundus measuring 2.7 x 2.7 x 2.6 cm. 4.  Small amount of free pelvic fluid. Electronically Signed   By: Bretta Bang III M.D.   On: 07/31/2017 07:11   US Pelvis Complete  Result Date: 07/31/2017 CLINICAL DATA:  Pelvic pain EXAM: TRANSABDOMINAL AND TRANSVAGINAL ULTRASOUND OF PELVIS TECHNIQUE: Study was performed transabdominally to optimize pelvic field of view evaluation and transvaginally to optimize internal visceral architecture evaluation. COMPARISON:  CT abdomen and pelvis July 31, 2017 FINDINGS: Uterus Measurements: 9.1 x 4.4 x 4.8 cm. There is a focal hypoechoic mass along the leftward aspect of the superior  uterine fundus measuring 2.7 x 2.7 x 2.6 cm, felt to be indicative of a leiomyoma. No other focal mass is seen within the myometrial region. Endometrium Thickness: 11 mm. There is an echogenic mass within the endometrium measuring 0.8 x 0.5 x 0.9 cm which does show blood flow consistent with an endometrial polyp. Right ovary Measurements: 2.3 x 2.1 x 2.3 cm. There is an elongated tubular structure arising from the right adnexa extending to the midline. This elongated tubular structure measures 3.7 x 2.1 x 2.1 cm cm. Left ovary Measurements: 2.9 x 2.4 x 2.7 cm. There is an elongated tubular structure on the left which extends to the midline measuring 4.7 x 2.5 x 2.5 cm. Other findings Mild free pelvic fluid. IMPRESSION: 1. Tubular structures arising from each adnexa which extend respectively to the midline as noted. Suspect bilateral tubo-ovarian abscesses. 2. Apparent endometrial polyp measuring 0.8 x 0.5 x 0.9 cm. This finding may warrant uterine sonohysterography to further evaluate. 3. Leiomyoma arising from the leftward aspect of the superior uterine fundus measuring 2.7 x 2.7 x 2.6 cm. 4.  Small amount of free pelvic fluid. Electronically Signed   By: Bretta Bang III M.D.   On: 07/31/2017 07:11   Ct Abdomen Pelvis W Contrast  Result Date: 07/31/2017 CLINICAL DATA:  37 year old female with abdominal pain. EXAM: CT ABDOMEN AND PELVIS WITH CONTRAST TECHNIQUE: Multidetector CT imaging of the abdomen and pelvis was performed using the standard protocol following bolus administration of intravenous contrast. CONTRAST:  75mL OMNIPAQUE IOHEXOL 300 MG/ML  SOLN COMPARISON:  Right upper quadrant ultrasound dated 07/30/2017 FINDINGS: Lower chest: The visualized lung bases are clear. No intra-abdominal free air.  Small free fluid within the pelvis. Hepatobiliary: There is an 11 mm hypodense lesion in the left lobe of the liver anteriorly (series 2, image 22) with apparent focus of enhancement. This lesion is not  well characterized but may represent a hemangioma as seen on the prior ultrasound. MRI may provide better characterisation. The liver lesions are better seen on the ultrasound. No intrahepatic biliary ductal dilatation. The gallbladder is unremarkable. Pancreas: Unremarkable. No pancreatic ductal dilatation or surrounding inflammatory changes. Spleen: Normal in size without focal abnormality. Adrenals/Urinary Tract: The adrenal glands are unremarkable. There is no hydronephrosis on either side. Minimal fullness of the right renal collecting system likely reactive to inflammatory changes of the pelvis. The urinary bladder is unremarkable. Stomach/Bowel: There is no bowel obstruction. Segmental thickening of the sigmoid colon likely reactive to inflammatory changes of the adnexa. The appendix contains fecal debris otherwise unremarkable. Vascular/Lymphatic: The abdominal aorta and IVC appear unremarkable. No portal venous gas. Multiple top-normal retroperitoneal lymph nodes, reactive. Reproductive: The uterus is anteverted. There is a 2.5 cm anterior  fundal fibroid. Dilated and fluid-filled tubular structures in the region of the adnexa bilaterally with mild adjacent inflammatory changes most likely represent pyosalpinx related to pelvic inflammatory disease. Cystic structures in the region of the ovaries measure 3 cm on the right may represent tubo-ovarian abscess. Correlation with pelvic ultrasound recommended. Other: None Musculoskeletal: No acute or significant osseous findings. IMPRESSION: 1. Findings most consistent with pelvic inflammatory disease/TOA. Correlation with clinical exam and further evaluation with pelvic ultrasound recommended. 2. Thickened segment of sigmoid colon, likely reactive to inflammatory changes of the adnexa. 3. An 11 mm hepatic lesion, incompletely characterized, possibly hemangioma. The liver lesions are better seen on the earlier ultrasound. Electronically Signed   By: Elgie Collard M.D.   On: 07/31/2017 05:14    ____________________________________________   PROCEDURES    Procedures   ____________________________________________   INITIAL IMPRESSION / ASSESSMENT AND PLAN / ED COURSE  As part of my medical decision making, I reviewed the following data within the electronic MEDICAL RECORD NUMBER   37 year old female presented with above-stated history and physical exam secondary to pelvic pain.  Concern for possible PID/tubo-ovarian abscess which was confirmed on both CT and ultrasound.  Patient given IV cefotetan 2 g as well as doxycycline.  Patient discussed with Dr. Bonney Aid GYN on call for hospital admission further evaluation and management.  Patient was given IV morphine in the emergency department times multiple doses with some improvement of pain however pain still persist.    ____________________________________________  FINAL CLINICAL IMPRESSION(S) / ED DIAGNOSES  Final diagnoses:  Abdominal complaints  Tubo-ovarian abscess  PID (acute pelvic inflammatory disease)     MEDICATIONS GIVEN DURING THIS VISIT:  Medications  prenatal multivitamin tablet 1 tablet (has no administration in time range)  cefoTEtan (CEFOTAN) 2 g in sodium chloride 0.9 % 100 mL IVPB (2 g Intravenous New Bag/Given 07/31/17 2247)  doxycycline (VIBRA-TABS) tablet 100 mg (100 mg Oral Given 07/31/17 2246)  ibuprofen (ADVIL,MOTRIN) tablet 800 mg (800 mg Oral Given 07/31/17 1058)  oxyCODONE-acetaminophen (PERCOCET/ROXICET) 5-325 MG per tablet 1-2 tablet (1 tablet Oral Given 07/31/17 2253)  morphine 2 MG/ML injection 1-2 mg (2 mg Intravenous Given 07/31/17 1641)  ondansetron (ZOFRAN) tablet 4 mg ( Oral See Alternative 07/31/17 1241)    Or  ondansetron (ZOFRAN) injection 4 mg (4 mg Intravenous Given 07/31/17 1241)  dextrose 5 %-0.45 % sodium chloride infusion ( Intravenous New Bag/Given 07/31/17 1807)  ondansetron (ZOFRAN) injection 4 mg (4 mg Intravenous Given 07/30/17 1959)   sodium chloride 0.9 % bolus 1,000 mL (0 mLs Intravenous Stopped 07/30/17 2101)  fentaNYL (SUBLIMAZE) injection 50 mcg (50 mcg Intravenous Given 07/31/17 0040)  morphine 2 MG/ML injection 2 mg (2 mg Intravenous Given 07/31/17 0338)  ondansetron (ZOFRAN) injection 4 mg (4 mg Intravenous Given 07/31/17 0337)  iohexol (OMNIPAQUE) 300 MG/ML solution 75 mL (75 mLs Intravenous Contrast Given 07/31/17 0418)  morphine 2 MG/ML injection 2 mg (2 mg Intravenous Given 07/31/17 0512)  morphine 4 MG/ML injection 4 mg (4 mg Intravenous Given 07/31/17 0615)  metroNIDAZOLE (FLAGYL) tablet 500 mg (500 mg Oral Given 07/31/17 0654)  morphine 4 MG/ML injection 4 mg (4 mg Intravenous Given 07/31/17 8295)     ED Discharge Orders    None       Note:  This document was prepared using Dragon voice recognition software and may include unintentional dictation errors.    Darci Current, MD 07/31/17 2329

## 2017-08-01 DIAGNOSIS — N7093 Salpingitis and oophoritis, unspecified: Secondary | ICD-10-CM

## 2017-08-01 LAB — CBC
HEMATOCRIT: 26.3 % — AB (ref 35.0–47.0)
Hemoglobin: 8.8 g/dL — ABNORMAL LOW (ref 12.0–16.0)
MCH: 29 pg (ref 26.0–34.0)
MCHC: 33.5 g/dL (ref 32.0–36.0)
MCV: 86.6 fL (ref 80.0–100.0)
Platelets: 250 10*3/uL (ref 150–440)
RBC: 3.04 MIL/uL — AB (ref 3.80–5.20)
RDW: 17.5 % — AB (ref 11.5–14.5)
WBC: 6.2 10*3/uL (ref 3.6–11.0)

## 2017-08-01 LAB — RPR: RPR: NONREACTIVE

## 2017-08-01 LAB — HIV ANTIBODY (ROUTINE TESTING W REFLEX): HIV Screen 4th Generation wRfx: NONREACTIVE

## 2017-08-01 MED ORDER — ACETAMINOPHEN 325 MG PO TABS
650.0000 mg | ORAL_TABLET | ORAL | Status: DC | PRN
  Administered 2017-08-01 – 2017-08-02 (×2): 650 mg via ORAL
  Filled 2017-08-01 (×2): qty 2

## 2017-08-01 MED ORDER — KETOROLAC TROMETHAMINE 30 MG/ML IJ SOLN
30.0000 mg | Freq: Three times a day (TID) | INTRAMUSCULAR | Status: DC
  Administered 2017-08-01 – 2017-08-02 (×4): 30 mg via INTRAVENOUS
  Filled 2017-08-01 (×4): qty 1

## 2017-08-01 MED ORDER — METRONIDAZOLE IN NACL 5-0.79 MG/ML-% IV SOLN
500.0000 mg | Freq: Two times a day (BID) | INTRAVENOUS | Status: DC
  Administered 2017-08-01 – 2017-08-02 (×3): 500 mg via INTRAVENOUS
  Filled 2017-08-01 (×5): qty 100

## 2017-08-01 MED ORDER — LACTATED RINGERS IV SOLN
INTRAVENOUS | Status: DC
  Administered 2017-08-01 – 2017-08-02 (×2): via INTRAVENOUS

## 2017-08-01 NOTE — Progress Notes (Signed)
Patient ID: Laurie Briggs, female   DOB: 07-02-1980, 37 y.o.   MRN: [id number]   CHIEF COMPLAINT:   Chief Complaint  Patient presents with  . Abdominal Pain    Subjective  Patient was seen this morning at about 08:30 am. She was feeling better and felt that her fever had broken overnight because she woke up drenched in sweat. She reported that her appetite had improved and she was going to eat breakfast. She has had some nausea and vomited a small amount this morning after eating according to nursing. She again spiked a temperature of 101.5 this afternoon. I spoke with the infectious disease physician this afternoon after the fever. He recommended that we discontinue to doxycycline and continue the flagyl and the cefotan. He recommended repeating imaging if she has not improved by tomorrow and considering IR drainage vs surgery.  I discussed this with the patient. She was very frustrated and tearful she is anxious about the hospital bills that she may face from a prolonged hospital stay. 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 She barely gets by even with working two jobs. She requested that I discharge her or she said that she may leave on her own because she has to get back to work. She stated that she would rather take the IV medicine as an outpatient or on the weekend when she isn't working. I discussed with her that this would not be possible. She is frustrated because I have discussed with her that we do not discharge people until they are 24 hours without a fever. I discussed with her how this is a recommendation from the CDC and she said, "yeah, a recommendation, but not what you have to do." I told her that this was how we cared for all patients who have developed TOAs. I told her that if her fever continued overnight that I would order a CT to reevaluate her TOAs and that we would consult IR for possible drainage. I discussed that an extreme measure would be to plan a surgery to remove or drain  the TOAs. She told me that she does not desire to have more children and that she would be okay with a salpingectomy, oophorectomy and possible hysterectomy. She was frustrated and said that I was "delaying what actually needs to happen" which is surgery or a IR drainage in her opinion. I emphasized that she had not even been in the hospital for 36 hours and we needed to give the antibiotics time to see if her fever will improve. If it does not or if she clinically worsens then these more invasive procedures can be discussed.    I discussed her positive Trichomonas result. I discussed with her the recommendations of the IR physician. More than 25 minutes were spent face to face with the patient in the room with more than 50% of the time spent providing counseling and discussing the plan of management..   Objective   Examination:  General exam: Appears calm and comfortable  Respiratory system: Clear to auscultation. Respiratory effort normal. HEENT: [place]/AT, PERRLA, no thrush, no stridor. Cardiovascular system: S1 & S2 heard, RRR. No JVD, murmurs, rubs, gallops or clicks. No pedal edema. Gastrointestinal system: Abdomen is nondistended, soft and nontender. No organomegaly or masses felt. Normal bowel sounds heard. Central nervous system: Alert and oriented. No focal neurological deficits. Extremities: Symmetric 5 x 5 power. Skin: No rashes, lesions or ulcers Psychiatry: Judgement and insight appear normal. Mood & affect appropriate.  VITALS:  height is 5\' 2"  (1.575 m) and weight is 125 lb (56.7 kg). Her oral temperature is 101.3 F (38.5 C) (abnormal). Her blood pressure is 113/66 and her pulse is 113 (abnormal). Her respiration is 18 and oxygen saturation is 98%.   I personally reviewed Labs under Results section.  Radiology Reports US Pelvis Transvanginal Non-ob (tv Only)  Result Date: 07/31/2017 CLINICAL DATA:  Pelvic pain EXAM: TRANSABDOMINAL AND TRANSVAGINAL ULTRASOUND OF PELVIS  TECHNIQUE: Study was performed transabdominally to optimize pelvic field of view evaluation and transvaginally to optimize internal visceral architecture evaluation. COMPARISON:  CT abdomen and pelvis July 31, 2017 FINDINGS: Uterus Measurements: 9.1 x 4.4 x 4.8 cm. There is a focal hypoechoic mass along the leftward aspect of the superior uterine fundus measuring 2.7 x 2.7 x 2.6 cm, felt to be indicative of a leiomyoma. No other focal mass is seen within the myometrial region. Endometrium Thickness: 11 mm. There is an echogenic mass within the endometrium measuring 0.8 x 0.5 x 0.9 cm which does show blood flow consistent with an endometrial polyp. Right ovary Measurements: 2.3 x 2.1 x 2.3 cm. There is an elongated tubular structure arising from the right adnexa extending to the midline. This elongated tubular structure measures 3.7 x 2.1 x 2.1 cm cm. Left ovary Measurements: 2.9 x 2.4 x 2.7 cm. There is an elongated tubular structure on the left which extends to the midline measuring 4.7 x 2.5 x 2.5 cm. Other findings Mild free pelvic fluid. IMPRESSION: 1. Tubular structures arising from each adnexa which extend respectively to the midline as noted. Suspect bilateral tubo-ovarian abscesses. 2. Apparent endometrial polyp measuring 0.8 x 0.5 x 0.9 cm. This finding may warrant uterine sonohysterography to further evaluate. 3. Leiomyoma arising from the leftward aspect of the superior uterine fundus measuring 2.7 x 2.7 x 2.6 cm. 4.  Small amount of free pelvic fluid. Electronically Signed   By: Bretta Bang III M.D.   On: 07/31/2017 07:11   US Pelvis Complete  Result Date: 07/31/2017 CLINICAL DATA:  Pelvic pain EXAM: TRANSABDOMINAL AND TRANSVAGINAL ULTRASOUND OF PELVIS TECHNIQUE: Study was performed transabdominally to optimize pelvic field of view evaluation and transvaginally to optimize internal visceral architecture evaluation. COMPARISON:  CT abdomen and pelvis July 31, 2017 FINDINGS: Uterus Measurements:  9.1 x 4.4 x 4.8 cm. There is a focal hypoechoic mass along the leftward aspect of the superior uterine fundus measuring 2.7 x 2.7 x 2.6 cm, felt to be indicative of a leiomyoma. No other focal mass is seen within the myometrial region. Endometrium Thickness: 11 mm. There is an echogenic mass within the endometrium measuring 0.8 x 0.5 x 0.9 cm which does show blood flow consistent with an endometrial polyp. Right ovary Measurements: 2.3 x 2.1 x 2.3 cm. There is an elongated tubular structure arising from the right adnexa extending to the midline. This elongated tubular structure measures 3.7 x 2.1 x 2.1 cm cm. Left ovary Measurements: 2.9 x 2.4 x 2.7 cm. There is an elongated tubular structure on the left which extends to the midline measuring 4.7 x 2.5 x 2.5 cm. Other findings Mild free pelvic fluid. IMPRESSION: 1. Tubular structures arising from each adnexa which extend respectively to the midline as noted. Suspect bilateral tubo-ovarian abscesses. 2. Apparent endometrial polyp measuring 0.8 x 0.5 x 0.9 cm. This finding may warrant uterine sonohysterography to further evaluate. 3. Leiomyoma arising from the leftward aspect of the superior uterine fundus measuring 2.7 x 2.7 x 2.6 cm. 4.  Small amount of free pelvic fluid. Electronically Signed   By: Bretta BangWilliam  Woodruff III M.D.   On: 07/31/2017 07:11   Ct Abdomen Pelvis W Contrast  Result Date: 07/31/2017 CLINICAL DATA:  37 year old female with abdominal pain. EXAM: CT ABDOMEN AND PELVIS WITH CONTRAST TECHNIQUE: Multidetector CT imaging of the abdomen and pelvis was performed using the standard protocol following bolus administration of intravenous contrast. CONTRAST:  75mL OMNIPAQUE IOHEXOL 300 MG/ML  SOLN COMPARISON:  Right upper quadrant ultrasound dated 07/30/2017 FINDINGS: Lower chest: The visualized lung bases are clear. No intra-abdominal free air.  Small free fluid within the pelvis. Hepatobiliary: There is an 11 mm hypodense lesion in the left lobe of the  liver anteriorly (series 2, image 22) with apparent focus of enhancement. This lesion is not well characterized but may represent a hemangioma as seen on the prior ultrasound. MRI may provide better characterisation. The liver lesions are better seen on the ultrasound. No intrahepatic biliary ductal dilatation. The gallbladder is unremarkable. Pancreas: Unremarkable. No pancreatic ductal dilatation or surrounding inflammatory changes. Spleen: Normal in size without focal abnormality. Adrenals/Urinary Tract: The adrenal glands are unremarkable. There is no hydronephrosis on either side. Minimal fullness of the right renal collecting system likely reactive to inflammatory changes of the pelvis. The urinary bladder is unremarkable. Stomach/Bowel: There is no bowel obstruction. Segmental thickening of the sigmoid colon likely reactive to inflammatory changes of the adnexa. The appendix contains fecal debris otherwise unremarkable. Vascular/Lymphatic: The abdominal aorta and IVC appear unremarkable. No portal venous gas. Multiple top-normal retroperitoneal lymph nodes, reactive. Reproductive: The uterus is anteverted. There is a 2.5 cm anterior fundal fibroid. Dilated and fluid-filled tubular structures in the region of the adnexa bilaterally with mild adjacent inflammatory changes most likely represent pyosalpinx related to pelvic inflammatory disease. Cystic structures in the region of the ovaries measure 3 cm on the right may represent tubo-ovarian abscess. Correlation with pelvic ultrasound recommended. Other: None Musculoskeletal: No acute or significant osseous findings. IMPRESSION: 1. Findings most consistent with pelvic inflammatory disease/TOA. Correlation with clinical exam and further evaluation with pelvic ultrasound recommended. 2. Thickened segment of sigmoid colon, likely reactive to inflammatory changes of the adnexa. 3. An 11 mm hepatic lesion, incompletely characterized, possibly hemangioma. The liver  lesions are better seen on the earlier ultrasound. Electronically Signed   By: Elgie CollardArash  Radparvar M.D.   On: 07/31/2017 05:14   [place] Abdomen Limited Ruq  Result Date: 07/30/2017 CLINICAL DATA:  Abdominal pain for 2 days. EXAM: ULTRASOUND ABDOMEN LIMITED RIGHT UPPER QUADRANT COMPARISON:  None. FINDINGS: Gallbladder: No gallstones or wall thickening visualized. No sonographic Murphy sign noted by sonographer. Common bile duct: Diameter: Normal, 2 mm. Liver: Multiple hyperechoic liver lesions. Examples in the left hepatic lobe at 1.6 x 1.3 x 1.2 cm and in the right hepatic lobe at 1.0 x 0.7 x 0.6 cm. Portal vein is patent on color Doppler imaging with normal direction of blood flow towards the liver. IMPRESSION: No acute process or explanation for abdominal pain. Multiple hyperechoic liver lesions. Most likely hemangiomas. If any history of primary malignancy or liver disease, recommend nonemergent outpatient abdominal MRI. If no such history, potential clinical strategies include ultrasound follow-up at 3-6 months or further characterization with MRI. Electronically Signed   By: Jeronimo GreavesKyle  Talbot M.D.   On: 07/30/2017 21:17       Assessment/Plan:  37 G1P1 with pelvic inflammatory disease and bilateral tuboovarian abscesses.  1. Continue IV antibiotics. Discussed with ID. Will dicontinue Doxycycline and continue cefotan  and flagyl.  Will obtain repeat CT if she has not improved by tomorrow AM and if she continues to have fevers overnight. 2. Pain control with morphine, percocet and toradol.  3. Anemia- iron replacement therapy will need to continue as an outpatient   Code Status: Full  Family Communication: daughter present in room   Disposition Plan: home  Time spent: 25 mintues   LOS: 1 day   Abena Erdman R Tallulah Hosman

## 2017-08-01 NOTE — Progress Notes (Signed)
RN noted temp of 101.9 at 0341 and notified CNM. CNM instructed RN to give motrin, cold water and cold compress.

## 2017-08-02 DIAGNOSIS — N7093 Salpingitis and oophoritis, unspecified: Secondary | ICD-10-CM

## 2017-08-02 LAB — CBC
HEMATOCRIT: 23.6 % — AB (ref 35.0–47.0)
Hemoglobin: 7.9 g/dL — ABNORMAL LOW (ref 12.0–16.0)
MCH: 29 pg (ref 26.0–34.0)
MCHC: 33.3 g/dL (ref 32.0–36.0)
MCV: 86.8 fL (ref 80.0–100.0)
Platelets: 216 10*3/uL (ref 150–440)
RBC: 2.71 MIL/uL — ABNORMAL LOW (ref 3.80–5.20)
RDW: 17.4 % — AB (ref 11.5–14.5)
WBC: 4.7 10*3/uL (ref 3.6–11.0)

## 2017-08-02 MED ORDER — METRONIDAZOLE 500 MG PO TABS: 500.0000 mg | ORAL_TABLET | Freq: Two times a day (BID) | ORAL | 0 refills | Status: AC

## 2017-08-02 MED ORDER — IBUPROFEN 600 MG PO TABS: 600.0000 mg | ORAL_TABLET | Freq: Four times a day (QID) | ORAL | 3 refills | Status: AC | PRN

## 2017-08-02 MED ORDER — DOXYCYCLINE HYCLATE 100 MG PO TABS: 100.0000 mg | ORAL_TABLET | Freq: Two times a day (BID) | ORAL | 0 refills | Status: AC

## 2017-08-02 MED ORDER — OXYCODONE-ACETAMINOPHEN 5-325 MG PO TABS: 1.0000 | ORAL_TABLET | ORAL | 0 refills | Status: DC | PRN

## 2017-08-02 MED ORDER — DOXYCYCLINE HYCLATE 100 MG PO TABS
100.0000 mg | ORAL_TABLET | Freq: Two times a day (BID) | ORAL | Status: DC
  Administered 2017-08-02: 100 mg via ORAL
  Filled 2017-08-02 (×4): qty 1

## 2017-08-02 NOTE — Progress Notes (Signed)
Dc inst reviewed with pt.  Verb u/o of f/u care and Rx for her meds.

## 2017-08-02 NOTE — Progress Notes (Signed)
Obstetric and Gynecology  Subjective  Overall significant improvement in pain.  Afebrile since yesterday afternoon.  Tolerating po.    Objective  Vital signs in last 24 hours: Temp:  [98 F (36.7 C)-101.3 F (38.5 C)] 99.1 F (37.3 C) (07/17 0153) Pulse Rate:  [80-113] 99 (07/17 0153) Resp:  [16-20] 20 (07/17 0153) BP: (95-113)/(63-70) 109/70 (07/17 0153) SpO2:  [98 %-100 %] 100 % (07/17 0153) Last BM Date: 07/27/17   Intake/Output Summary (Last 24 hours) at 08/02/2017 0706 Last data filed at 08/02/2017 8295 Gross per 24 hour  Intake 603 ml  Output 2700 ml  Net -2097 ml    General: NAD Pulmonary: no increased work of breathing Abdomen: soft, mild BLQ tenderness, able to tolerate deep palpation, no guarding Extremities: no edema  Labs: Results for orders placed or performed during the hospital encounter of 07/31/17 (from the past 24 hour(s))  CBC     Status: Abnormal   Collection Time: 08/02/17  5:33 AM  Result Value Ref Range   WBC 4.7 3.6 - 11.0 K/uL   RBC 2.71 (L) 3.80 - 5.20 MIL/uL   Hemoglobin 7.9 (L) 12.0 - 16.0 g/dL   HCT 62.1 (L) 30.8 - 65.7 %   MCV 86.8 80.0 - 100.0 fL   MCH 29.0 26.0 - 34.0 pg   MCHC 33.3 32.0 - 36.0 g/dL   RDW 84.6 (H) 96.2 - 95.2 %   Platelets 216 150 - 440 K/uL    Cultures: Results for orders placed or performed during the hospital encounter of 07/31/17  Chlamydia/NGC rt PCR ([facility] only)     Status: None   Collection Time: 07/31/17  5:52 AM  Result Value Ref Range Status   Specimen source GC/Chlam ENDOCERVICAL  Final   Chlamydia Tr NOT DETECTED NOT DETECTED Final   N gonorrhoeae NOT DETECTED NOT DETECTED Final    Comment: (NOTE) 100  This methodology has not been evaluated in pregnant women or in 200  patients with a history of hysterectomy. 300 400  This methodology will not be performed on patients less than 35  years of age. Performed at [facility], [address] [place], [place] [address]   Wet prep, genital      Status: Abnormal   Collection Time: 07/31/17  5:52 AM  Result Value Ref Range Status   Yeast Wet Prep HPF POC NONE SEEN NONE SEEN Final   Trich, Wet Prep PRESENT (A) NONE SEEN Final   Clue Cells Wet Prep HPF POC PRESENT (A) NONE SEEN Final   WBC, Wet Prep HPF POC MANY (A) NONE SEEN Final   Sperm PRESENT  Final    Comment: Performed at [facility], [address] [place], [place] [address]  Culture, blood (Routine X 2) w Reflex to ID Panel     Status: None (Preliminary result)   Collection Time: 08/01/17  4:26 AM  Result Value Ref Range Status   Specimen Description BLOOD RIGHT ASSIST CONTROL  Final   Special Requests   Final    BOTTLES DRAWN AEROBIC AND ANAEROBIC Blood Culture adequate volume   Culture   Final    NO GROWTH < 24 HOURS Performed at [facility], [address] [place], [place] [address]    Report Status PENDING  Incomplete  Culture, blood (Routine X 2) w Reflex to ID Panel     Status: None (Preliminary result)   Collection Time: 08/01/17  4:46 AM  Result Value Ref Range Status  Specimen Description BLOOD LEFT ASSIST CONTROL  Final   Special Requests   Final    BOTTLES DRAWN AEROBIC AND ANAEROBIC Blood Culture results may not be optimal due to an excessive volume of blood received in culture bottles   Culture   Final    NO GROWTH < 24 HOURS Performed at [facility], [address] [place], [place] [id number]    Report Status PENDING  Incomplete    Imaging:  Assessment   37 y.o. admitted for bilateral TOA  Plan   1) TOA  - continue cefotetan, flagyl added for positive trichomonas and anaerobic coverage - doxycyline stopped per ID but given that this is the standard outpatient po regimen to finish treatment for PID will restart if remains afebrile for 24-hrs may discharge this afternoon on doxycyline and flagyl - Afebrile since 16:34 on 08/02/17

## 2017-08-02 NOTE — Progress Notes (Signed)
Discharged to home.  Ambulatory to car 

## 2017-08-04 NOTE — Discharge Summary (Signed)
Physician Discharge Summary  Patient ID: Laurie Briggs MRN: [id number] DOB/AGE: 1980/03/18 37 y.o.  Admit date: 07/31/2017 Discharge date: 08/04/2017  Admission Diagnoses:  Discharge Diagnoses:  Active Problems:   TOA (tubo-ovarian abscess)   Discharged Condition: good  Hospital Course: 37 year old AAF presenting to ER with TOA.  Gonorrhea and chlamydia cultures negative, trichomonas positive on wet mount.  Patient placed on cefotetan, doxycyline, and flagyl.  Fever did defervesce after 48-hr IV antibiotic course, with significant improvement in abdominal pain also noted.  Patient discharge on doxycyline and flagyl po.  She complete a 14 day course and I will see her int he next week.  Plan on repeat imaging to document resolution.    Consults: None  Significant Diagnostic Studies: Results for orders placed or performed during the hospital encounter of 07/31/17 (from the past 72 hour(s))  CBC     Status: Abnormal   Collection Time: 08/02/17  5:33 AM  Result Value Ref Range   WBC 4.7 3.6 - 11.0 K/uL   RBC 2.71 (L) 3.80 - 5.20 MIL/uL   Hemoglobin 7.9 (L) 12.0 - 16.0 g/dL   HCT 40.9 (L) 81.1 - 91.4 %   MCV 86.8 80.0 - 100.0 fL   MCH 29.0 26.0 - 34.0 pg   MCHC 33.3 32.0 - 36.0 g/dL   RDW 78.2 (H) 95.6 - 21.3 %   Platelets 216 150 - 440 K/uL    Comment: Performed at [facility], [address] [place], [place] [address]   US Pelvis Transvanginal Non-ob (tv Only)  Result Date: 07/31/2017 CLINICAL DATA:  Pelvic pain EXAM: TRANSABDOMINAL AND TRANSVAGINAL ULTRASOUND OF PELVIS TECHNIQUE: Study was performed transabdominally to optimize pelvic field of view evaluation and transvaginally to optimize internal visceral architecture evaluation. COMPARISON:  CT abdomen and pelvis July 31, 2017 FINDINGS: Uterus Measurements: 9.1 x 4.4 x 4.8 cm. There is a focal hypoechoic mass along the leftward aspect of the superior uterine fundus measuring 2.7 x 2.7 x 2.6 cm, felt to be indicative  of a leiomyoma. No other focal mass is seen within the myometrial region. Endometrium Thickness: 11 mm. There is an echogenic mass within the endometrium measuring 0.8 x 0.5 x 0.9 cm which does show blood flow consistent with an endometrial polyp. Right ovary Measurements: 2.3 x 2.1 x 2.3 cm. There is an elongated tubular structure arising from the right adnexa extending to the midline. This elongated tubular structure measures 3.7 x 2.1 x 2.1 cm cm. Left ovary Measurements: 2.9 x 2.4 x 2.7 cm. There is an elongated tubular structure on the left which extends to the midline measuring 4.7 x 2.5 x 2.5 cm. Other findings Mild free pelvic fluid. IMPRESSION: 1. Tubular structures arising from each adnexa which extend respectively to the midline as noted. Suspect bilateral tubo-ovarian abscesses. 2. Apparent endometrial polyp measuring 0.8 x 0.5 x 0.9 cm. This finding may warrant uterine sonohysterography to further evaluate. 3. Leiomyoma arising from the leftward aspect of the superior uterine fundus measuring 2.7 x 2.7 x 2.6 cm. 4.  Small amount of free pelvic fluid. Electronically Signed   By: Bretta Bang III M.D.   On: 07/31/2017 07:11   US Pelvis Complete  Result Date: 07/31/2017 CLINICAL DATA:  Pelvic pain EXAM: TRANSABDOMINAL AND TRANSVAGINAL ULTRASOUND OF PELVIS TECHNIQUE: Study was performed transabdominally to optimize pelvic field of view evaluation and transvaginally to optimize internal visceral architecture evaluation. COMPARISON:  CT abdomen and pelvis July 31, 2017 FINDINGS: Uterus Measurements: 9.1 x 4.4  x 4.8 cm. There is a focal hypoechoic mass along the leftward aspect of the superior uterine fundus measuring 2.7 x 2.7 x 2.6 cm, felt to be indicative of a leiomyoma. No other focal mass is seen within the myometrial region. Endometrium Thickness: 11 mm. There is an echogenic mass within the endometrium measuring 0.8 x 0.5 x 0.9 cm which does show blood flow consistent with an endometrial  polyp. Right ovary Measurements: 2.3 x 2.1 x 2.3 cm. There is an elongated tubular structure arising from the right adnexa extending to the midline. This elongated tubular structure measures 3.7 x 2.1 x 2.1 cm cm. Left ovary Measurements: 2.9 x 2.4 x 2.7 cm. There is an elongated tubular structure on the left which extends to the midline measuring 4.7 x 2.5 x 2.5 cm. Other findings Mild free pelvic fluid. IMPRESSION: 1. Tubular structures arising from each adnexa which extend respectively to the midline as noted. Suspect bilateral tubo-ovarian abscesses. 2. Apparent endometrial polyp measuring 0.8 x 0.5 x 0.9 cm. This finding may warrant uterine sonohysterography to further evaluate. 3. Leiomyoma arising from the leftward aspect of the superior uterine fundus measuring 2.7 x 2.7 x 2.6 cm. 4.  Small amount of free pelvic fluid. Electronically Signed   By: Bretta Bang III M.D.   On: 07/31/2017 07:11   Ct Abdomen Pelvis W Contrast  Result Date: 07/31/2017 CLINICAL DATA:  37 year old female with abdominal pain. EXAM: CT ABDOMEN AND PELVIS WITH CONTRAST TECHNIQUE: Multidetector CT imaging of the abdomen and pelvis was performed using the standard protocol following bolus administration of intravenous contrast. CONTRAST:  75mL OMNIPAQUE IOHEXOL 300 MG/ML  SOLN COMPARISON:  Right upper quadrant ultrasound dated 07/30/2017 FINDINGS: Lower chest: The visualized lung bases are clear. No intra-abdominal free air.  Small free fluid within the pelvis. Hepatobiliary: There is an 11 mm hypodense lesion in the left lobe of the liver anteriorly (series 2, image 22) with apparent focus of enhancement. This lesion is not well characterized but may represent a hemangioma as seen on the prior ultrasound. MRI may provide better characterisation. The liver lesions are better seen on the ultrasound. No intrahepatic biliary ductal dilatation. The gallbladder is unremarkable. Pancreas: Unremarkable. No pancreatic ductal dilatation  or surrounding inflammatory changes. Spleen: Normal in size without focal abnormality. Adrenals/Urinary Tract: The adrenal glands are unremarkable. There is no hydronephrosis on either side. Minimal fullness of the right renal collecting system likely reactive to inflammatory changes of the pelvis. The urinary bladder is unremarkable. Stomach/Bowel: There is no bowel obstruction. Segmental thickening of the sigmoid colon likely reactive to inflammatory changes of the adnexa. The appendix contains fecal debris otherwise unremarkable. Vascular/Lymphatic: The abdominal aorta and IVC appear unremarkable. No portal venous gas. Multiple top-normal retroperitoneal lymph nodes, reactive. Reproductive: The uterus is anteverted. There is a 2.5 cm anterior fundal fibroid. Dilated and fluid-filled tubular structures in the region of the adnexa bilaterally with mild adjacent inflammatory changes most likely represent pyosalpinx related to pelvic inflammatory disease. Cystic structures in the region of the ovaries measure 3 cm on the right may represent tubo-ovarian abscess. Correlation with pelvic ultrasound recommended. Other: None Musculoskeletal: No acute or significant osseous findings. IMPRESSION: 1. Findings most consistent with pelvic inflammatory disease/TOA. Correlation with clinical exam and further evaluation with pelvic ultrasound recommended. 2. Thickened segment of sigmoid colon, likely reactive to inflammatory changes of the adnexa. 3. An 11 mm hepatic lesion, incompletely characterized, possibly hemangioma. The liver lesions are better seen on the earlier ultrasound. Electronically Signed  By: Elgie CollardArash  Radparvar M.D.   On: 07/31/2017 05:14    Treatments: IV hydration and antibiotics: cefotetan, doxycyline, and flagyl  Discharge Exam: Blood pressure 114/79, pulse 87, temperature 98.3 F (36.8 C), temperature source Axillary, resp. rate 18, height 5\' 2"  (1.575 m), weight 125 lb (56.7 kg), last menstrual  period 07/24/2017, SpO2 100 %. General appearance: alert, appears stated age and no distress Resp: no increased work of breathing GI: soft, non-tender; bowel sounds normal; no masses,  no organomegaly  Disposition:   Discharge Instructions    Activity as tolerated   Complete by:  As directed    Call MD for:   Complete by:  As directed    For heavy vaginal bleeding greater than 1 pad an hour   Call MD for:  difficulty breathing, headache or visual disturbances   Complete by:  As directed    Call MD for:  extreme fatigue   Complete by:  As directed    Call MD for:  hives   Complete by:  As directed    Call MD for:  persistant dizziness or light-headedness   Complete by:  As directed    Call MD for:  persistant nausea and vomiting   Complete by:  As directed    Call MD for:  severe uncontrolled pain   Complete by:  As directed    Call MD for:  temperature >100.4   Complete by:  As directed    Diet general   Complete by:  As directed    Sexual acrtivity   Complete by:  As directed    No intercourse, tampons, or anything vaginally for 6 weeks     Allergies as of 08/02/2017   No Known Allergies     Medication List    TAKE these medications   albuterol 108 (90 Base) MCG/ACT inhaler Commonly known as:  PROVENTIL HFA;VENTOLIN HFA Inhale 2 puffs into the lungs every 6 (six) hours as needed for wheezing or shortness of breath.   doxycycline 100 MG tablet Commonly known as:  VIBRA-TABS Take 1 tablet (100 mg total) by mouth every 12 (twelve) hours for 14 days.   ibuprofen 600 MG tablet Commonly known as:  ADVIL,MOTRIN Take 1 tablet (600 mg total) by mouth every 6 (six) hours as needed.   metroNIDAZOLE 500 MG tablet Commonly known as:  FLAGYL Take 1 tablet (500 mg total) by mouth 2 (two) times daily for 14 days.   oxyCODONE-acetaminophen 5-325 MG tablet Commonly known as:  PERCOCET/ROXICET Take 1-2 tablets by mouth every 3 (three) hours as needed (moderate to severe pain  (when tolerating fluids)).      Follow-up Information    Vena AustriaStaebler, Dwon Sky, MD Follow up in 1 week(s).   Specialty:  Obstetrics and Gynecology Why:  Hospital follow up Contact information: [address] [place] [address] [phone]           Signed: Vena Austriandreas Anayely Constantine 08/04/2017, 10:13 AM

## 2017-08-06 LAB — CULTURE, BLOOD (ROUTINE X 2)
CULTURE: NO GROWTH
Culture: NO GROWTH
Special Requests: ADEQUATE

## 2017-11-20 ENCOUNTER — Encounter
Admit: 2017-11-20 | Discharge: 2017-11-21 | Payer: BLUE CROSS/BLUE SHIELD | Attending: Advanced Practice Midwife | Primary: Advanced Practice Midwife

## 2017-11-20 DIAGNOSIS — R102 Pelvic and perineal pain: Secondary | ICD-10-CM

## 2017-11-20 DIAGNOSIS — L709 Acne, unspecified: Secondary | ICD-10-CM

## 2017-11-20 DIAGNOSIS — Z0000 Encounter for general adult medical examination without abnormal findings: Principal | ICD-10-CM

## 2017-12-28 ENCOUNTER — Encounter
Admit: 2017-12-28 | Discharge: 2017-12-29 | Payer: BLUE CROSS/BLUE SHIELD | Attending: Nutritionist | Primary: Nutritionist

## 2017-12-28 DIAGNOSIS — E7800 Pure hypercholesterolemia, unspecified: Principal | ICD-10-CM

## 2018-01-04 ENCOUNTER — Ambulatory Visit: Admit: 2018-01-04 | Discharge: 2018-01-05 | Payer: BLUE CROSS/BLUE SHIELD

## 2018-01-04 DIAGNOSIS — D229 Melanocytic nevi, unspecified: Secondary | ICD-10-CM

## 2018-01-04 DIAGNOSIS — L811 Chloasma: Principal | ICD-10-CM

## 2018-01-04 MED ORDER — FLUOCINOLONE 0.01 % TOPICAL CREAM
Freq: Every day | TOPICAL | 3 refills | 0.00000 days | Status: CP
Start: 2018-01-04 — End: 2018-03-15

## 2018-01-04 MED ORDER — ADAPALENE 0.1 % TOPICAL CREAM
1 refills | 0 days | Status: CP
Start: 2018-01-04 — End: 2018-03-15

## 2018-01-04 MED ORDER — HYDROQUINONE 4 % TOPICAL CREAM
Freq: Every day | TOPICAL | 3 refills | 0 days | Status: CP
Start: 2018-01-04 — End: 2019-01-04

## 2018-01-05 ENCOUNTER — Ambulatory Visit
Admit: 2018-01-05 | Discharge: 2018-01-06 | Payer: BLUE CROSS/BLUE SHIELD | Attending: Obstetrics & Gynecology | Primary: Obstetrics & Gynecology

## 2018-01-05 DIAGNOSIS — R102 Pelvic and perineal pain: Principal | ICD-10-CM

## 2018-01-24 ENCOUNTER — Encounter: Admit: 2018-01-24 | Discharge: 2018-01-25 | Payer: BLUE CROSS/BLUE SHIELD

## 2018-01-24 DIAGNOSIS — R102 Pelvic and perineal pain: Principal | ICD-10-CM

## 2018-02-02 MED ORDER — ALBUTEROL SULFATE HFA 90 MCG/ACTUATION AEROSOL INHALER
Freq: Every day | RESPIRATORY_TRACT | 1 refills | 0 days | Status: CP | PRN
Start: 2018-02-02 — End: 2018-02-19

## 2018-02-19 MED ORDER — ALBUTEROL SULFATE HFA 90 MCG/ACTUATION AEROSOL INHALER
Freq: Every day | RESPIRATORY_TRACT | 1 refills | 0 days | Status: CP | PRN
Start: 2018-02-19 — End: 2018-02-26

## 2018-02-20 ENCOUNTER — Encounter
Admit: 2018-02-20 | Discharge: 2018-02-21 | Payer: BLUE CROSS/BLUE SHIELD | Attending: Obstetrics & Gynecology | Primary: Obstetrics & Gynecology

## 2018-02-20 DIAGNOSIS — R768 Other specified abnormal immunological findings in serum: Secondary | ICD-10-CM

## 2018-02-20 DIAGNOSIS — N946 Dysmenorrhea, unspecified: Secondary | ICD-10-CM

## 2018-02-20 DIAGNOSIS — R102 Pelvic and perineal pain: Principal | ICD-10-CM

## 2018-02-20 DIAGNOSIS — N83201 Unspecified ovarian cyst, right side: Secondary | ICD-10-CM

## 2018-02-20 DIAGNOSIS — R198 Other specified symptoms and signs involving the digestive system and abdomen: Secondary | ICD-10-CM

## 2018-02-20 DIAGNOSIS — K5900 Constipation, unspecified: Secondary | ICD-10-CM

## 2018-02-20 DIAGNOSIS — N7011 Chronic salpingitis: Secondary | ICD-10-CM

## 2018-02-22 MED ORDER — PEG-ELECTROLYTE SOLUTION 420 GRAM ORAL SOLUTION
0 refills | 0 days | Status: CP
Start: 2018-02-22 — End: 2018-05-16

## 2018-02-26 ENCOUNTER — Encounter
Admit: 2018-02-26 | Discharge: 2018-02-27 | Payer: BLUE CROSS/BLUE SHIELD | Attending: Advanced Practice Midwife | Primary: Advanced Practice Midwife

## 2018-02-26 DIAGNOSIS — R062 Wheezing: Principal | ICD-10-CM

## 2018-02-26 MED ORDER — ALBUTEROL SULFATE HFA 90 MCG/ACTUATION AEROSOL INHALER
Freq: Every day | RESPIRATORY_TRACT | 1 refills | 0 days | Status: CP | PRN
Start: 2018-02-26 — End: 2018-05-16

## 2018-02-26 MED ORDER — IBUPROFEN 600 MG TABLET
ORAL_TABLET | Freq: Four times a day (QID) | ORAL | 3 refills | 0.00000 days | Status: CP | PRN
Start: 2018-02-26 — End: 2018-07-07

## 2018-03-06 ENCOUNTER — Encounter
Admit: 2018-03-06 | Discharge: 2018-03-07 | Disposition: A | Discharge status: Home / Self Care | Payer: BLUE CROSS/BLUE SHIELD | Attending: Emergency Medicine

## 2018-03-06 DIAGNOSIS — R062 Wheezing: Principal | ICD-10-CM

## 2018-03-06 MED ORDER — PREDNISONE 20 MG TABLET
ORAL_TABLET | Freq: Every day | ORAL | 0 refills | 0 days | Status: CP
Start: 2018-03-06 — End: 2018-03-21

## 2018-03-06 MED ORDER — ALBUTEROL SULFATE HFA 90 MCG/ACTUATION AEROSOL INHALER
RESPIRATORY_TRACT | 0 refills | 0.00000 days | Status: CP | PRN
Start: 2018-03-06 — End: 2018-04-19

## 2018-03-06 MED ORDER — OMEPRAZOLE 20 MG CAPSULE,DELAYED RELEASE
ORAL_CAPSULE | Freq: Every day | ORAL | 3 refills | 0 days | Status: CP
Start: 2018-03-06 — End: 2018-03-06

## 2018-03-14 DIAGNOSIS — R102 Pelvic and perineal pain: Principal | ICD-10-CM

## 2018-03-15 ENCOUNTER — Encounter: Admit: 2018-03-15 | Discharge: 2018-03-15 | Payer: BLUE CROSS/BLUE SHIELD

## 2018-03-15 ENCOUNTER — Encounter
Admit: 2018-03-15 | Discharge: 2018-03-15 | Payer: BLUE CROSS/BLUE SHIELD | Attending: Anesthesiology | Primary: Anesthesiology

## 2018-03-15 DIAGNOSIS — R102 Pelvic and perineal pain: Principal | ICD-10-CM

## 2018-03-26 ENCOUNTER — Emergency Department
Admission: EM | Admit: 2018-03-26 | Discharge: 2018-03-27 | Disposition: A | Discharge status: Home / Self Care | Attending: Emergency Medicine | Admitting: Emergency Medicine

## 2018-03-26 ENCOUNTER — Emergency Department

## 2018-03-26 ENCOUNTER — Other Ambulatory Visit: Payer: Self-pay

## 2018-03-26 ENCOUNTER — Encounter: Payer: Self-pay | Admitting: Emergency Medicine

## 2018-03-26 DIAGNOSIS — R0602 Shortness of breath: Secondary | ICD-10-CM | POA: Diagnosis present

## 2018-03-26 DIAGNOSIS — J45901 Unspecified asthma with (acute) exacerbation: Secondary | ICD-10-CM | POA: Insufficient documentation

## 2018-03-26 DIAGNOSIS — Z79899 Other long term (current) drug therapy: Secondary | ICD-10-CM | POA: Diagnosis not present

## 2018-03-26 DIAGNOSIS — Z87891 Personal history of nicotine dependence: Secondary | ICD-10-CM | POA: Diagnosis not present

## 2018-03-26 HISTORY — DX: Unspecified asthma, uncomplicated: J45.909

## 2018-03-26 LAB — CBC WITH DIFFERENTIAL/PLATELET
ABS IMMATURE GRANULOCYTES: 0.02 10*3/uL (ref 0.00–0.07)
Basophils Absolute: 0.1 10*3/uL (ref 0.0–0.1)
Basophils Relative: 1 %
Eosinophils Absolute: 0.9 10*3/uL — ABNORMAL HIGH (ref 0.0–0.5)
Eosinophils Relative: 10 %
HCT: 30.7 % — ABNORMAL LOW (ref 36.0–46.0)
HEMOGLOBIN: 9.3 g/dL — AB (ref 12.0–15.0)
Immature Granulocytes: 0 %
LYMPHS PCT: 34 %
Lymphs Abs: 3 10*3/uL (ref 0.7–4.0)
MCH: 25.5 pg — AB (ref 26.0–34.0)
MCHC: 30.3 g/dL (ref 30.0–36.0)
MCV: 84.3 fL (ref 80.0–100.0)
MONO ABS: 0.6 10*3/uL (ref 0.1–1.0)
Monocytes Relative: 7 %
NEUTROS ABS: 4.2 10*3/uL (ref 1.7–7.7)
Neutrophils Relative %: 48 %
Platelets: 339 10*3/uL (ref 150–400)
RBC: 3.64 MIL/uL — ABNORMAL LOW (ref 3.87–5.11)
RDW: 17.6 % — ABNORMAL HIGH (ref 11.5–15.5)
WBC: 8.7 10*3/uL (ref 4.0–10.5)
nRBC: 0 % (ref 0.0–0.2)

## 2018-03-26 LAB — BASIC METABOLIC PANEL
Anion gap: 10 (ref 5–15)
BUN: 14 mg/dL (ref 6–20)
CO2: 20 mmol/L — ABNORMAL LOW (ref 22–32)
Calcium: 9.3 mg/dL (ref 8.9–10.3)
Chloride: 111 mmol/L (ref 98–111)
Creatinine, Ser: 0.76 mg/dL (ref 0.44–1.00)
GFR calc Af Amer: >60 mL/min (ref >60)
Glucose, Bld: 101 mg/dL — ABNORMAL HIGH (ref 70–99)
POTASSIUM: 3.3 mmol/L — AB (ref 3.5–5.1)
Sodium: 141 mmol/L (ref 135–145)

## 2018-03-26 MED ORDER — ALBUTEROL SULFATE (2.5 MG/3ML) 0.083% IN NEBU
2.5000 mg | INHALATION_SOLUTION | Freq: Once | RESPIRATORY_TRACT | Status: AC
  Administered 2018-03-26: 2.5 mg via RESPIRATORY_TRACT
  Filled 2018-03-26: qty 3

## 2018-03-26 MED ORDER — ALBUTEROL SULFATE (2.5 MG/3ML) 0.083% IN NEBU
2.5000 mg | INHALATION_SOLUTION | Freq: Once | RESPIRATORY_TRACT | Status: AC
  Administered 2018-03-26: 2.5 mg via RESPIRATORY_TRACT

## 2018-03-26 MED ORDER — PREDNISONE 20 MG PO TABS: 60.0000 mg | ORAL_TABLET | Freq: Every day | ORAL | 0 refills | Status: AC

## 2018-03-26 MED ORDER — METHYLPREDNISOLONE SODIUM SUCC 125 MG IJ SOLR
INTRAMUSCULAR | Status: AC
  Administered 2018-03-26: 125 mg via INTRAVENOUS
  Filled 2018-03-26: qty 2

## 2018-03-26 MED ORDER — SODIUM CHLORIDE 0.9 % IV BOLUS
1000.0000 mL | Freq: Once | INTRAVENOUS | Status: AC
  Administered 2018-03-26: 1000 mL via INTRAVENOUS

## 2018-03-26 MED ORDER — ALBUTEROL SULFATE (2.5 MG/3ML) 0.083% IN NEBU
INHALATION_SOLUTION | RESPIRATORY_TRACT | Status: AC
  Filled 2018-03-26: qty 3

## 2018-03-26 MED ORDER — IPRATROPIUM-ALBUTEROL 0.5-2.5 (3) MG/3ML IN SOLN
3.0000 mL | Freq: Once | RESPIRATORY_TRACT | Status: AC
  Administered 2018-03-26: 3 mL via RESPIRATORY_TRACT

## 2018-03-26 MED ORDER — METHYLPREDNISOLONE SODIUM SUCC 125 MG IJ SOLR
125.0000 mg | Freq: Once | INTRAMUSCULAR | Status: AC
  Administered 2018-03-26: 125 mg via INTRAVENOUS

## 2018-03-26 NOTE — ED Provider Notes (Signed)
[facility] Emergency Department Provider Note ____________________________________________   First MD Initiated Contact with Patient 03/26/18 2014     (approximate)  I have reviewed the triage vital signs and the nursing notes.   HISTORY  Chief Complaint Asthma    HPI Laurie Briggs is a 38 y.o. female with history of asthma who presents with shortness of breath, acute onset this evening just prior to arrival while driving in a car, not relieved by her albuterol inhaler, and associated with chest tightness.  The patient states that she recently had an asthma exacerbation and was treated with a 15-day steroid course which she finished about a week ago.  She states that she had been feeling gradually more short of breath since finishing it.  Past Medical History:  Diagnosis Date  . Asthma     Patient Active Problem List   Diagnosis Date Noted  . TOA (tubo-ovarian abscess) 07/31/2017    Past Surgical History:  Procedure Laterality Date  . HERNIA REPAIR      Prior to Admission medications   Medication Sig Start Date End Date Taking? Authorizing Provider  albuterol (PROVENTIL HFA;VENTOLIN HFA) 108 (90 Base) MCG/ACT inhaler Inhale 2 puffs into the lungs every 6 (six) hours as needed for wheezing or shortness of breath. 07/02/17  Yes Emily Filbert, MD  ibuprofen (ADVIL,MOTRIN) 600 MG tablet Take 1 tablet (600 mg total) by mouth every 6 (six) hours as needed. 08/02/17  Yes Vena Austria, MD  predniSONE (DELTASONE) 20 MG tablet Take 3 tablets (60 mg total) by mouth daily for 5 days. 03/26/18 03/31/18  Dionne Bucy, MD    Allergies Patient has no known allergies.  No family history on file.  Social History Social History   Tobacco Use  . Smoking status: Former Smoker    Last attempt to quit: 01/17/2018    Years since quitting: 0.1  . Smokeless tobacco: Never Used  Substance Use Topics  . Alcohol use: Yes  . Drug use: Never     Review of Systems  Constitutional: No fever. Eyes: No redness. ENT: No nasal congestion. Cardiovascular: Denies chest pain. Respiratory: Positive for shortness of breath. Gastrointestinal: No vomiting. Genitourinary: Negative for flank pain.  Musculoskeletal: Negative for back pain. Skin: Negative for rash. Neurological: Negative for headache.   ____________________________________________   PHYSICAL EXAM:  VITAL SIGNS: ED Triage Vitals [03/26/18 2006]  Enc Vitals Group     BP (!) 168/103     Pulse Rate (!) 132     Resp (!) 32     Temp      Temp Source Oral     SpO2 100 %     Weight 130 lb (59 kg)     Height 5\' 2"  (1.575 m)     Head Circumference      Peak Flow      Pain Score 7     Pain Loc      Pain Edu?      Excl. in GC?     Constitutional: Alert and oriented.  Uncomfortable appearing and in acute respiratory distress. Eyes: Conjunctivae are normal.  Head: Atraumatic. Nose: No congestion/rhinnorhea. Mouth/Throat: Mucous membranes are moist.   Neck: Normal range of motion.  Cardiovascular: Tachycardic, regular rhythm. Grossly normal heart sounds.  Good peripheral circulation. Respiratory: Increased respiratory effort with retractions.  Diffuse wheezing bilaterally. Gastrointestinal: No distention.  Musculoskeletal: Extremities warm and well perfused.  Neurologic:  Normal speech and language. No gross focal neurologic deficits are appreciated.  Skin:  Skin is warm and dry. No rash noted. Psychiatric: Anxious appearing.  ____________________________________________   LABS (all labs ordered are listed, but only abnormal results are displayed)  Labs Reviewed  BASIC METABOLIC PANEL - Abnormal; Notable for the following components:      Result Value   Potassium 3.3 (*)    CO2 20 (*)    Glucose, Bld 101 (*)    All other components within normal limits  CBC WITH DIFFERENTIAL/PLATELET - Abnormal; Notable for the following components:   RBC 3.64 (*)     Hemoglobin 9.3 (*)    HCT 30.7 (*)    MCH 25.5 (*)    RDW 17.6 (*)    Eosinophils Absolute 0.9 (*)    All other components within normal limits   ____________________________________________  EKG  ED ECG REPORT I, Dionne BucySebastian Marionette Meskill, the attending physician, personally viewed and interpreted this ECG.  Date: 03/26/2018 EKG Time: 2357 Rate: 129 Rhythm: Sinus tachycardia QRS Axis: normal Intervals: normal ST/T Wave abnormalities: normal Narrative Interpretation: no evidence of acute ischemia  ____________________________________________  RADIOLOGY  CXR: No focal infiltrate  ____________________________________________   PROCEDURES  Procedure(s) performed: No  Procedures  Critical Care performed: Yes  CRITICAL CARE Performed by: Dionne BucySebastian Jayd Cadieux   Total critical care time: 35 minutes  Critical care time was exclusive of separately billable procedures and treating other patients.  Critical care was necessary to treat or prevent imminent or life-threatening deterioration.  Critical care was time spent personally by me on the following activities: development of treatment plan with patient and/or surrogate as well as nursing, discussions with consultants, evaluation of patient's response to treatment, examination of patient, obtaining history from patient or surrogate, ordering and performing treatments and interventions, ordering and review of laboratory studies, ordering and review of radiographic studies, pulse oximetry and re-evaluation of patient's condition. ____________________________________________   INITIAL IMPRESSION / ASSESSMENT AND PLAN / ED COURSE  Pertinent labs & imaging results that were available during my care of the patient were reviewed by me and considered in my medical decision making (see chart for details).  38 year old female with history of asthma presents with acute onset of shortness of breath and respiratory distress, not relieved  by her albuterol inhaler.  On ED arrival the patient was extremely uncomfortable appearing, in acute respiratory distress, and with diffuse wheezing bilaterally on lung exam.  She was tachycardic but otherwise with normal vital signs.  I reviewed the past medical records in Epic.  The patient had no recent ED visits or admissions within the last 6 months.  The presentation was consistent with acute asthma exacerbation.  We immediately placed the patient on the monitor and an O2 nonrebreather, and started duo nebs.  We gave a fluid bolus and IV steroid as well.  ----------------------------------------- 11:50 PM on 03/26/2018 -----------------------------------------  The patient markedly improved after the bronchodilators and steroid.  On reassessment, she states she is feeling much better.  Her wheezing is resolved.  The lab work-up is unremarkable.  Chest x-ray shows no infiltrate.  She still remained tachycardic to the 130s, likely due to side effect of the albuterol.  I gave an additional fluid bolus.  After this, the patient's heart rate was in the 120s.  She appeared very comfortable.  I discussed the tachycardia with her.  EKG shows sinus tachycardia with no other acute abnormalities.  There is no clinical evidence for PE or other nefarious cause of this tachycardia.  The patient has no palpitations and states  she feels very well.  She thanked me for saving her life.  She states she would like to go home.  At this time given that the tachycardia is likely due to the medication side effect and the patient has no symptoms related to it, she is stable for discharge home.  I will prescribe a steroid course and the patient will continue her albuterol at home.  I gave her thorough return precautions and she expressed understanding. ____________________________________________   FINAL CLINICAL IMPRESSION(S) / ED DIAGNOSES  Final diagnoses:  Exacerbation of asthma, unspecified asthma severity,  unspecified whether persistent      NEW MEDICATIONS STARTED DURING THIS VISIT:  Discharge Medication List as of 03/26/2018 11:53 PM    START taking these medications   Details  predniSONE (DELTASONE) 20 MG tablet Take 3 tablets (60 mg total) by mouth daily for 5 days., Starting Mon 03/26/2018, Until Sat 03/31/2018, Normal         Note:  This document was prepared using Dragon voice recognition software and may include unintentional dictation errors.    Dionne Bucy, MD 03/27/18 [phone]

## 2018-03-26 NOTE — ED Notes (Signed)
Per MD Siadecki pt okayed to be discharged after IV fluids completed and then pt walked without dizziness.

## 2018-03-26 NOTE — ED Triage Notes (Signed)
Pt arrived to ED with asthma attack. Onset approx 20 min ago. Pt anxious with increased work of breathing noted at this time. Rescue inhaler not working per pt.

## 2018-03-26 NOTE — ED Notes (Signed)
Pt states that she feels "much better and the tightness in my chest is better." Pt in NAD at this time.

## 2018-03-26 NOTE — Discharge Instructions (Signed)
Take the prednisone as prescribed for the next 5 days and finish the full course.  Continue to use albuterol every 4-6 hours for the next several days.  Follow-up with your doctor in 1 to 2 weeks.  Return to the ER for new or worsening shortness of breath, chest tightness, fever, palpitations, chest pain, or any other new or worsening symptoms that concern you.

## 2018-03-27 NOTE — ED Notes (Signed)
Pt states that she "feels fine" and pt able to walk around room without any difficulty. Pt advised to come back to the ED if asthma begins to flare up or have any difficulty breathing. Pt acknowledges this and feels okay to be discharged home.

## 2018-03-28 DIAGNOSIS — R102 Pelvic and perineal pain: Principal | ICD-10-CM

## 2018-04-03 DIAGNOSIS — R102 Pelvic and perineal pain: Principal | ICD-10-CM

## 2018-04-03 MED ORDER — BUDESONIDE-FORMOTEROL HFA 160 MCG-4.5 MCG/ACTUATION AEROSOL INHALER
Freq: Two times a day (BID) | RESPIRATORY_TRACT | 11 refills | 0.00000 days | Status: CP
Start: 2018-04-03 — End: 2018-05-16

## 2018-04-04 ENCOUNTER — Encounter
Admit: 2018-04-04 | Discharge: 2018-04-05 | Payer: BLUE CROSS/BLUE SHIELD | Attending: Rehabilitative and Restorative Service Providers" | Primary: Rehabilitative and Restorative Service Providers"

## 2018-04-04 DIAGNOSIS — R768 Other specified abnormal immunological findings in serum: Principal | ICD-10-CM

## 2018-04-04 DIAGNOSIS — R252 Cramp and spasm: Principal | ICD-10-CM

## 2018-04-04 DIAGNOSIS — D219 Benign neoplasm of connective and other soft tissue, unspecified: Principal | ICD-10-CM

## 2018-04-04 DIAGNOSIS — Z8719 Personal history of other diseases of the digestive system: Principal | ICD-10-CM

## 2018-04-04 DIAGNOSIS — R102 Pelvic and perineal pain: Principal | ICD-10-CM

## 2018-04-04 DIAGNOSIS — R278 Other lack of coordination: Principal | ICD-10-CM

## 2018-04-04 DIAGNOSIS — Z9889 Other specified postprocedural states: Principal | ICD-10-CM

## 2018-04-19 MED ORDER — ALBUTEROL SULFATE HFA 90 MCG/ACTUATION AEROSOL INHALER
RESPIRATORY_TRACT | 0 refills | 0 days | Status: CP | PRN
Start: 2018-04-19 — End: 2018-07-23

## 2018-05-16 ENCOUNTER — Ambulatory Visit: Admit: 2018-05-16 | Discharge: 2018-05-17 | Payer: BLUE CROSS/BLUE SHIELD

## 2018-05-16 ENCOUNTER — Encounter
Admit: 2018-05-16 | Discharge: 2018-05-17 | Payer: TRICARE (CHAMPUS) | Attending: Pulmonary Disease | Primary: Pulmonary Disease

## 2018-05-16 DIAGNOSIS — R062 Wheezing: Principal | ICD-10-CM

## 2018-05-16 DIAGNOSIS — J302 Other seasonal allergic rhinitis: Secondary | ICD-10-CM

## 2018-05-16 MED ORDER — FLUTICASONE PROPIONATE 115 MCG-SALMETEROL 21 MCG/ACTUATION HFA INHALER
Freq: Two times a day (BID) | RESPIRATORY_TRACT | 11 refills | 0.00000 days | Status: CP
Start: 2018-05-16 — End: 2018-08-14

## 2018-05-16 MED ORDER — LORATADINE 10 MG TABLET
ORAL_TABLET | Freq: Every day | ORAL | 11 refills | 0 days | Status: CP
Start: 2018-05-16 — End: 2019-05-16

## 2018-05-30 ENCOUNTER — Ambulatory Visit
Admit: 2018-05-30 | Discharge: 2018-06-28 | Payer: BLUE CROSS/BLUE SHIELD | Attending: Rehabilitative and Restorative Service Providers" | Primary: Rehabilitative and Restorative Service Providers"

## 2018-05-30 ENCOUNTER — Encounter
Admit: 2018-05-30 | Discharge: 2018-06-28 | Payer: BLUE CROSS/BLUE SHIELD | Attending: Rehabilitative and Restorative Service Providers" | Primary: Rehabilitative and Restorative Service Providers"

## 2018-05-30 DIAGNOSIS — R102 Pelvic and perineal pain: Principal | ICD-10-CM

## 2018-05-30 DIAGNOSIS — R278 Other lack of coordination: Secondary | ICD-10-CM

## 2018-05-30 DIAGNOSIS — R252 Cramp and spasm: Secondary | ICD-10-CM

## 2018-06-07 DIAGNOSIS — R252 Cramp and spasm: Secondary | ICD-10-CM

## 2018-06-07 DIAGNOSIS — R102 Pelvic and perineal pain: Principal | ICD-10-CM

## 2018-06-07 DIAGNOSIS — R278 Other lack of coordination: Secondary | ICD-10-CM

## 2018-06-13 DIAGNOSIS — R278 Other lack of coordination: Secondary | ICD-10-CM

## 2018-06-13 DIAGNOSIS — R252 Cramp and spasm: Secondary | ICD-10-CM

## 2018-06-13 DIAGNOSIS — R102 Pelvic and perineal pain: Principal | ICD-10-CM

## 2018-06-20 ENCOUNTER — Ambulatory Visit: Admit: 2018-06-20 | Discharge: 2018-06-21 | Payer: BLUE CROSS/BLUE SHIELD

## 2018-06-20 DIAGNOSIS — D649 Anemia, unspecified: Principal | ICD-10-CM

## 2018-06-20 DIAGNOSIS — R252 Cramp and spasm: Secondary | ICD-10-CM

## 2018-06-20 DIAGNOSIS — R102 Pelvic and perineal pain: Principal | ICD-10-CM

## 2018-06-20 DIAGNOSIS — R278 Other lack of coordination: Secondary | ICD-10-CM

## 2018-06-20 DIAGNOSIS — N83201 Unspecified ovarian cyst, right side: Secondary | ICD-10-CM

## 2018-06-27 DIAGNOSIS — R278 Other lack of coordination: Secondary | ICD-10-CM

## 2018-06-27 DIAGNOSIS — R252 Cramp and spasm: Secondary | ICD-10-CM

## 2018-06-27 DIAGNOSIS — R102 Pelvic and perineal pain: Principal | ICD-10-CM

## 2018-07-04 ENCOUNTER — Encounter
Admit: 2018-07-04 | Discharge: 2018-07-28 | Payer: BLUE CROSS/BLUE SHIELD | Attending: Rehabilitative and Restorative Service Providers" | Primary: Rehabilitative and Restorative Service Providers"

## 2018-07-04 ENCOUNTER — Encounter
Admit: 2018-07-04 | Discharge: 2018-07-04 | Payer: BLUE CROSS/BLUE SHIELD | Attending: Anesthesiology | Primary: Anesthesiology

## 2018-07-04 ENCOUNTER — Ambulatory Visit: Admit: 2018-07-04 | Discharge: 2018-07-05 | Payer: TRICARE (CHAMPUS) | Attending: Family | Primary: Family

## 2018-07-04 ENCOUNTER — Encounter: Admit: 2018-07-04 | Discharge: 2018-07-04 | Payer: BLUE CROSS/BLUE SHIELD

## 2018-07-04 DIAGNOSIS — Z01818 Encounter for other preprocedural examination: Secondary | ICD-10-CM

## 2018-07-04 DIAGNOSIS — Z1159 Encounter for screening for other viral diseases: Principal | ICD-10-CM

## 2018-07-04 DIAGNOSIS — R252 Cramp and spasm: Secondary | ICD-10-CM

## 2018-07-04 DIAGNOSIS — R278 Other lack of coordination: Secondary | ICD-10-CM

## 2018-07-04 DIAGNOSIS — R102 Pelvic and perineal pain: Principal | ICD-10-CM

## 2018-07-04 DIAGNOSIS — N946 Dysmenorrhea, unspecified: Principal | ICD-10-CM

## 2018-07-06 ENCOUNTER — Encounter
Admit: 2018-07-06 | Discharge: 2018-07-07 | Payer: BLUE CROSS/BLUE SHIELD | Attending: Anesthesiology | Primary: Anesthesiology

## 2018-07-06 ENCOUNTER — Encounter: Admit: 2018-07-06 | Discharge: 2018-07-07 | Payer: BLUE CROSS/BLUE SHIELD

## 2018-07-06 ENCOUNTER — Encounter
Admit: 2018-07-06 | Discharge: 2018-07-07 | Payer: BLUE CROSS/BLUE SHIELD | Attending: Certified Registered" | Primary: Certified Registered"

## 2018-07-06 DIAGNOSIS — N946 Dysmenorrhea, unspecified: Principal | ICD-10-CM

## 2018-07-06 MED ORDER — ACETAMINOPHEN 325 MG TABLET
ORAL_TABLET | Freq: Four times a day (QID) | ORAL | 1 refills | 0 days | Status: CP
Start: 2018-07-06 — End: 2018-07-20
  Filled 2018-07-07: qty 100, 13d supply, fill #0

## 2018-07-06 MED ORDER — OXYCODONE 5 MG TABLET
ORAL_TABLET | Freq: Four times a day (QID) | ORAL | 0 refills | 0.00000 days | Status: CP | PRN
Start: 2018-07-06 — End: 2018-07-11
  Filled 2018-07-07: qty 15, 4d supply, fill #0

## 2018-07-06 MED ORDER — IBUPROFEN 600 MG TABLET
ORAL_TABLET | Freq: Four times a day (QID) | ORAL | 1 refills | 0 days | Status: CP
Start: 2018-07-06 — End: 2018-07-22
  Filled 2018-07-07: qty 60, 15d supply, fill #0

## 2018-07-06 MED ORDER — POLYETHYLENE GLYCOL 3350 17 GRAM/DOSE ORAL POWDER
Freq: Every day | ORAL | 0 refills | 0.00000 days | Status: CP | PRN
Start: 2018-07-06 — End: 2018-08-06
  Filled 2018-07-07: qty 510, 30d supply, fill #0

## 2018-07-07 MED FILL — OXYCODONE 5 MG TABLET: 4 days supply | Qty: 15 | Fill #0 | Status: AC

## 2018-07-07 MED FILL — ACETAMINOPHEN 325 MG TABLET: 13 days supply | Qty: 100 | Fill #0 | Status: AC

## 2018-07-07 MED FILL — CLEARLAX 17 GRAM/DOSE ORAL POWDER: 30 days supply | Qty: 510 | Fill #0 | Status: AC

## 2018-07-07 MED FILL — IBUPROFEN 600 MG TABLET: 15 days supply | Qty: 60 | Fill #0 | Status: AC

## 2018-07-24 MED ORDER — ALBUTEROL SULFATE HFA 90 MCG/ACTUATION AEROSOL INHALER
RESPIRATORY_TRACT | 0 refills | 0 days | Status: CP | PRN
Start: 2018-07-24 — End: 2018-08-14

## 2018-08-07 ENCOUNTER — Encounter
Admit: 2018-08-07 | Discharge: 2018-08-08 | Payer: BLUE CROSS/BLUE SHIELD | Attending: Obstetrics & Gynecology | Primary: Obstetrics & Gynecology

## 2018-08-07 DIAGNOSIS — Z90710 Acquired absence of both cervix and uterus: Principal | ICD-10-CM

## 2018-08-14 MED ORDER — FLUTICASONE PROPIONATE 230 MCG-SALMETEROL 21 MCG/ACTUATION HFA INHALER
Freq: Two times a day (BID) | RESPIRATORY_TRACT | 11 refills | 0.00000 days | Status: CP
Start: 2018-08-14 — End: ?

## 2018-08-14 MED ORDER — ALBUTEROL SULFATE HFA 90 MCG/ACTUATION AEROSOL INHALER
RESPIRATORY_TRACT | 11 refills | 0 days | Status: CP | PRN
Start: 2018-08-14 — End: 2019-08-14

## 2018-08-14 MED ORDER — ALBUTEROL SULFATE 2.5 MG/3 ML (0.083 %) SOLUTION FOR NEBULIZATION
RESPIRATORY_TRACT | 11 refills | 5.00000 days | Status: CP | PRN
Start: 2018-08-14 — End: 2019-08-14

## 2018-08-23 ENCOUNTER — Encounter
Admit: 2018-08-23 | Discharge: 2018-08-27 | Payer: BLUE CROSS/BLUE SHIELD | Attending: Rehabilitative and Restorative Service Providers" | Primary: Rehabilitative and Restorative Service Providers"

## 2018-08-23 DIAGNOSIS — R252 Cramp and spasm: Secondary | ICD-10-CM

## 2018-08-23 DIAGNOSIS — R278 Other lack of coordination: Secondary | ICD-10-CM

## 2018-08-23 DIAGNOSIS — R102 Pelvic and perineal pain: Principal | ICD-10-CM

## 2018-09-05 ENCOUNTER — Ambulatory Visit
Admit: 2018-09-05 | Discharge: 2018-09-26 | Payer: BLUE CROSS/BLUE SHIELD | Attending: Rehabilitative and Restorative Service Providers" | Primary: Rehabilitative and Restorative Service Providers"

## 2018-09-05 ENCOUNTER — Encounter
Admit: 2018-09-05 | Discharge: 2018-09-26 | Payer: BLUE CROSS/BLUE SHIELD | Attending: Rehabilitative and Restorative Service Providers" | Primary: Rehabilitative and Restorative Service Providers"

## 2018-09-05 DIAGNOSIS — R278 Other lack of coordination: Secondary | ICD-10-CM

## 2018-09-05 DIAGNOSIS — R102 Pelvic and perineal pain: Principal | ICD-10-CM

## 2018-09-05 DIAGNOSIS — R252 Cramp and spasm: Secondary | ICD-10-CM

## 2018-09-12 DIAGNOSIS — R252 Cramp and spasm: Secondary | ICD-10-CM

## 2018-09-12 DIAGNOSIS — R278 Other lack of coordination: Secondary | ICD-10-CM

## 2018-09-12 DIAGNOSIS — R102 Pelvic and perineal pain: Principal | ICD-10-CM

## 2018-09-19 DIAGNOSIS — R252 Cramp and spasm: Secondary | ICD-10-CM

## 2018-09-19 DIAGNOSIS — R102 Pelvic and perineal pain: Secondary | ICD-10-CM

## 2018-09-19 DIAGNOSIS — R278 Other lack of coordination: Secondary | ICD-10-CM

## 2018-09-26 DIAGNOSIS — R102 Pelvic and perineal pain: Secondary | ICD-10-CM

## 2018-09-26 DIAGNOSIS — R252 Cramp and spasm: Secondary | ICD-10-CM

## 2018-09-26 DIAGNOSIS — R278 Other lack of coordination: Secondary | ICD-10-CM

## 2018-10-02 ENCOUNTER — Other Ambulatory Visit: Payer: Self-pay

## 2018-10-02 ENCOUNTER — Ambulatory Visit (LOCAL_COMMUNITY_HEALTH_CENTER)

## 2018-10-02 DIAGNOSIS — Z23 Encounter for immunization: Secondary | ICD-10-CM

## 2018-10-02 NOTE — Progress Notes (Signed)
Pt states she is about to have a grandchild and does not ever remember getting a Tdap. No records of Tdap in [place] and Epic.

## 2018-10-03 IMAGING — CT CT ABD-PELV W/ CM
2 of 4 series · 15 of 46 positions shown, 17 images · IV contrast (APPLIED)
Comparison: Right upper quadrant ultrasound dated 07/30/2017

CLINICAL DATA: 37-year-old female with abdominal pain.

EXAM:
CT ABDOMEN AND PELVIS WITH CONTRAST
TECHNIQUE: Multidetector CT imaging of the abdomen and pelvis was performed
using the standard protocol following bolus administration of
intravenous contrast.
CONTRAST:  75mL OMNIPAQUE IOHEXOL 300 MG/ML  SOLN

[Series 2: routine abd/pel with · axial · 0.63mm/px · z∈[-949,-549]mm · 12 of 88 slices shown, 14 images]
[im 4/88  soft-tissue]
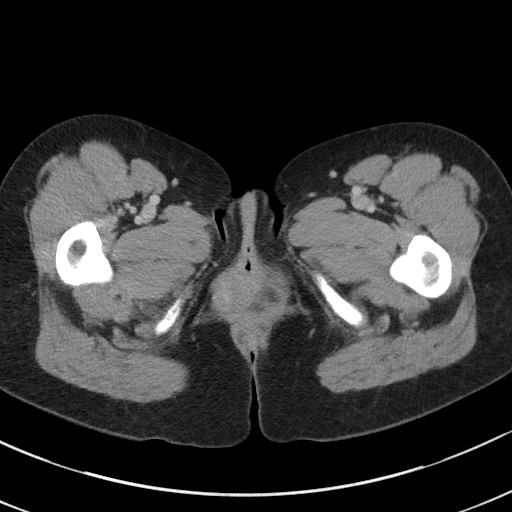
[im 4/88  bone]
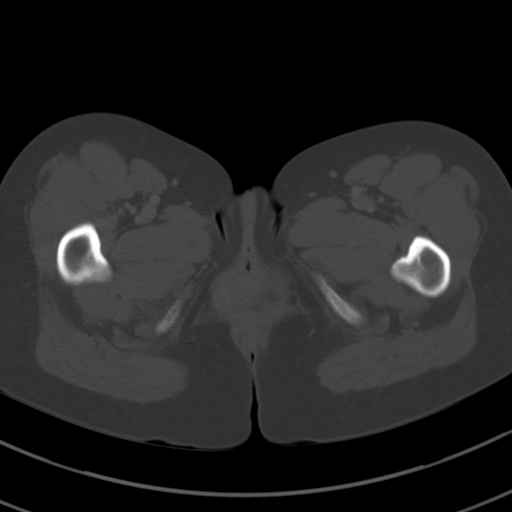
[im 12/88  soft-tissue]
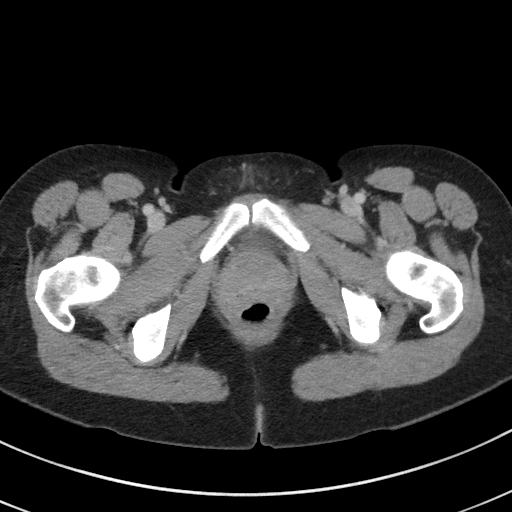
[im 19/88  soft-tissue]
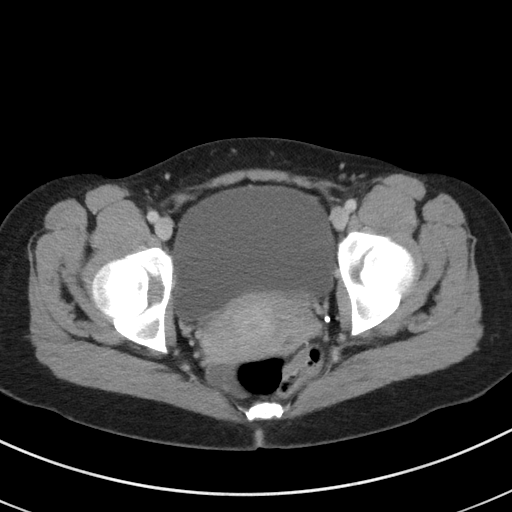
[im 27/88  soft-tissue]
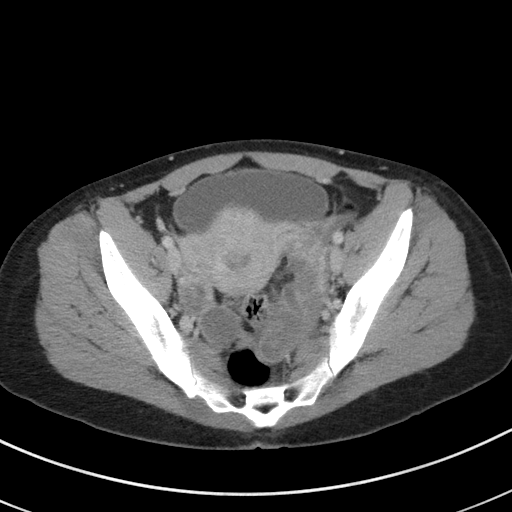
[im 35/88  soft-tissue]
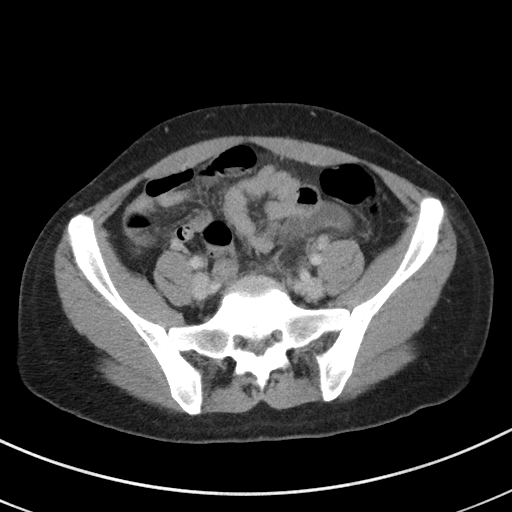
[im 42/88  soft-tissue]
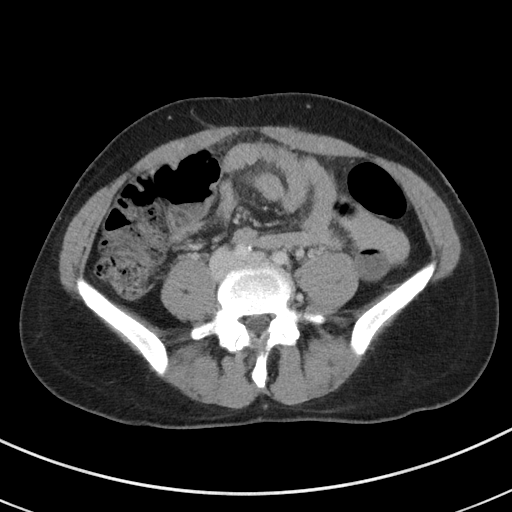
[im 46/88  soft-tissue]
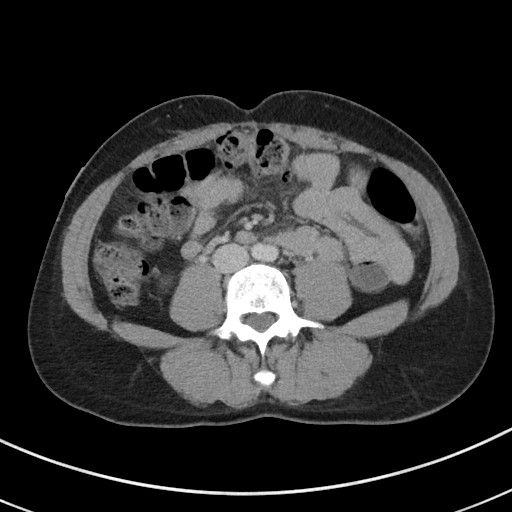
[im 53/88  soft-tissue]
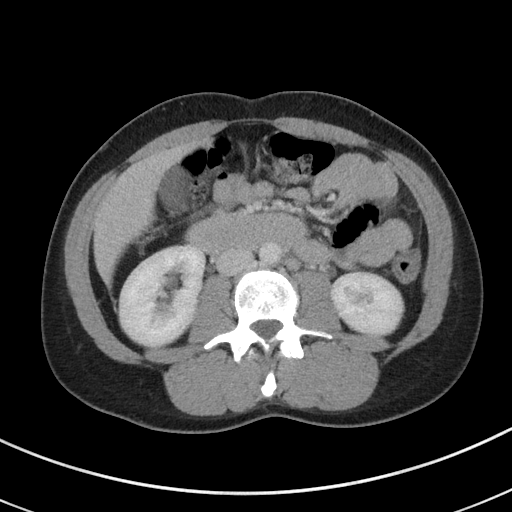
[im 61/88  soft-tissue]
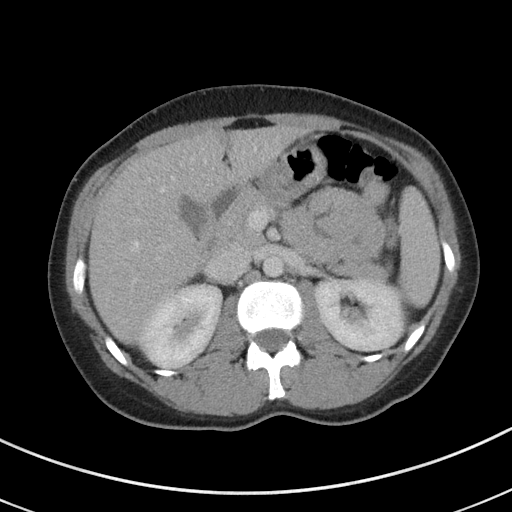
[im 61/88  bone]
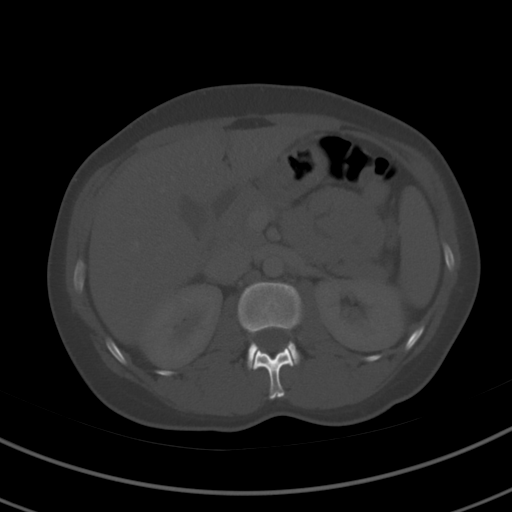
[im 69/88  soft-tissue]
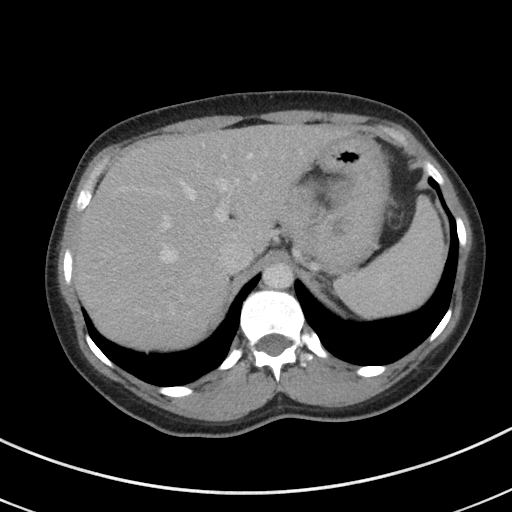
[im 76/88  soft-tissue]
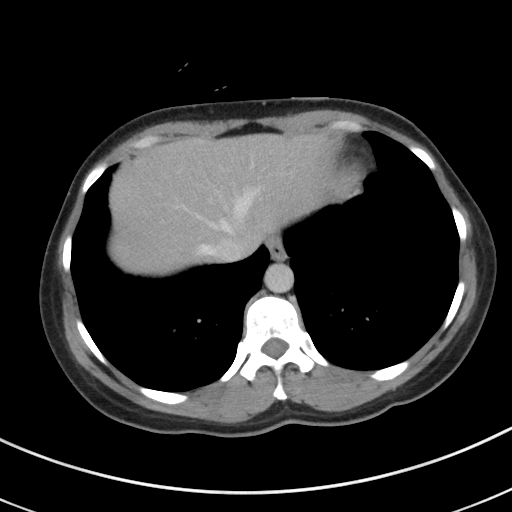
[im 84/88  soft-tissue]
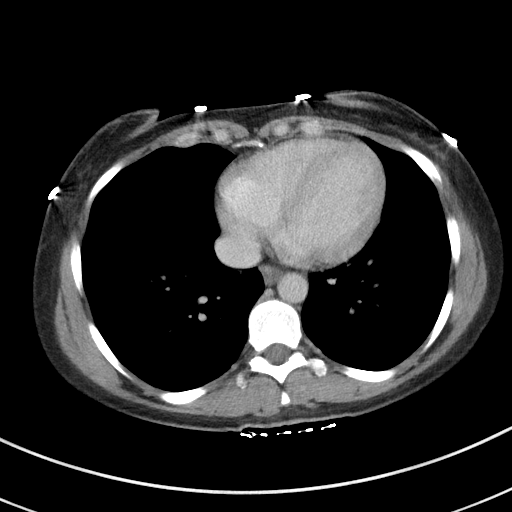

[Series 5: coronal st · coronal · 0.62mm/px · 3 of 77 slices shown]
[im 26/77  soft-tissue]
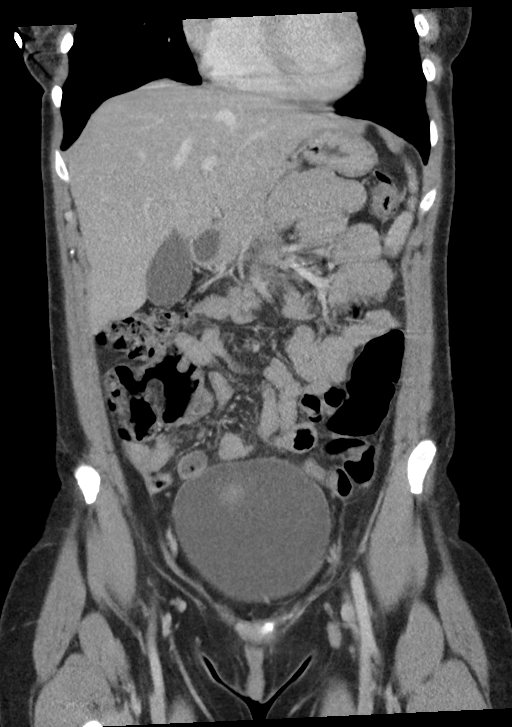
[im 34/77  soft-tissue]
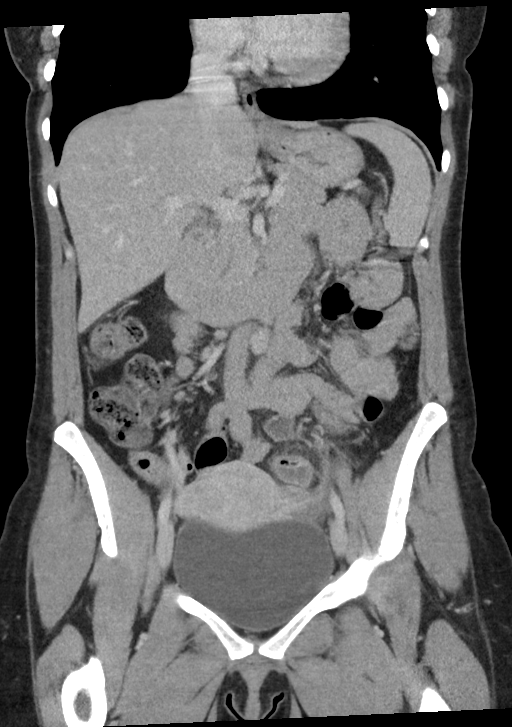
[im 43/77  soft-tissue]
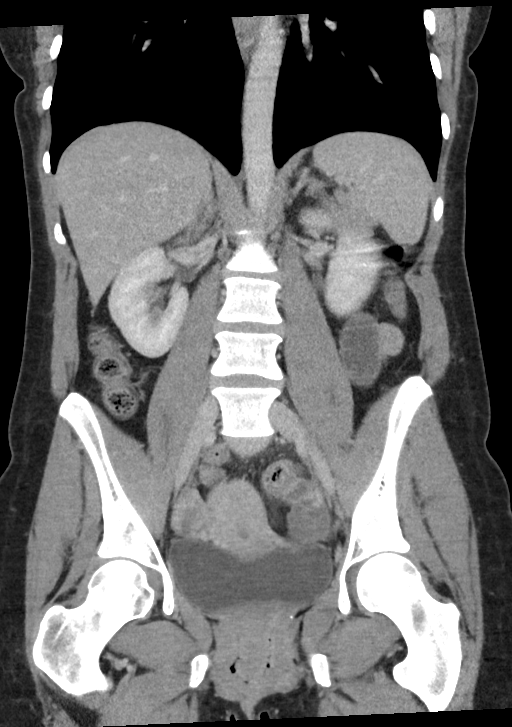

[15 of 46 positions shown; findings below may reference images not displayed]

FINDINGS: Lower chest: The visualized lung bases are clear.

No intra-abdominal free air.  Small free fluid within the pelvis.

Hepatobiliary: There is an 11 mm hypodense lesion in the left lobe
of the liver anteriorly (series 2, image 22) with apparent focus of
enhancement. This lesion is not well characterized but may represent
a hemangioma as seen on the prior ultrasound. MRI may provide better
characterisation. The liver lesions are better seen on the
ultrasound. No intrahepatic biliary ductal dilatation. The
gallbladder is unremarkable.

Pancreas: Unremarkable. No pancreatic ductal dilatation or
surrounding inflammatory changes.

Spleen: Normal in size without focal abnormality.

Adrenals/Urinary Tract: The adrenal glands are unremarkable. There
is no hydronephrosis on either side. Minimal fullness of the right
renal collecting system likely reactive to inflammatory changes of
the pelvis. The urinary bladder is unremarkable.

Stomach/Bowel: There is no bowel obstruction. Segmental thickening
of the sigmoid colon likely reactive to inflammatory changes of the
adnexa. The appendix contains fecal debris otherwise unremarkable.

Vascular/Lymphatic: The abdominal aorta and IVC appear unremarkable.
No portal venous gas. Multiple top-normal retroperitoneal lymph
nodes, reactive.

Reproductive: The uterus is anteverted. There is a 2.5 cm anterior
fundal fibroid. Dilated and fluid-filled tubular structures in the
region of the adnexa bilaterally with mild adjacent inflammatory
changes most likely represent pyosalpinx related to pelvic
inflammatory disease. Cystic structures in the region of the ovaries
measure 3 cm on the right may represent tubo-ovarian abscess.
Correlation with pelvic ultrasound recommended.

Other: None

Musculoskeletal: No acute or significant osseous findings.
IMPRESSION: 1. Findings most consistent with pelvic inflammatory disease/TOA.
Correlation with clinical exam and further evaluation with pelvic
ultrasound recommended.
2. Thickened segment of sigmoid colon, likely reactive to
inflammatory changes of the adnexa.
3. An 11 mm hepatic lesion, incompletely characterized, possibly
hemangioma. The liver lesions are better seen on the earlier
ultrasound.

## 2018-10-25 ENCOUNTER — Encounter
Admit: 2018-10-25 | Discharge: 2018-10-26 | Payer: BLUE CROSS/BLUE SHIELD | Attending: Pulmonary Disease | Primary: Pulmonary Disease

## 2018-10-25 DIAGNOSIS — J4530 Mild persistent asthma, uncomplicated: Secondary | ICD-10-CM

## 2018-10-25 DIAGNOSIS — J45909 Unspecified asthma, uncomplicated: Secondary | ICD-10-CM

## 2018-10-25 MED ORDER — ALBUTEROL SULFATE HFA 90 MCG/ACTUATION AEROSOL INHALER
RESPIRATORY_TRACT | 11 refills | 0 days | Status: CP | PRN
Start: 2018-10-25 — End: 2019-10-25

## 2018-11-05 MED ORDER — IBUPROFEN 600 MG TABLET: tablet | 0 refills | 0 days | Status: AC

## 2018-12-06 MED ORDER — PREDNISONE 20 MG TABLET
ORAL_TABLET | Freq: Every day | ORAL | 0 refills | 5.00000 days | Status: CP
Start: 2018-12-06 — End: 2018-12-11

## 2018-12-07 MED ORDER — IBUPROFEN 600 MG TABLET
ORAL_TABLET | Freq: Four times a day (QID) | ORAL | 0 refills | 15 days | Status: CP | PRN
Start: 2018-12-07 — End: ?

## 2018-12-19 MED ORDER — PREDNISONE 20 MG TABLET
ORAL_TABLET | Freq: Every day | ORAL | 0 refills | 0 days | Status: CP
Start: 2018-12-19 — End: 2018-12-24

## 2018-12-19 MED ORDER — FLUTICASONE 232 MCG-SALMETEROL 14 MCG/ACTUATION BREATH ACTIVATED POWDR
Freq: Two times a day (BID) | RESPIRATORY_TRACT | 11 refills | 0.00000 days | Status: CP
Start: 2018-12-19 — End: ?

## 2019-01-03 DIAGNOSIS — R102 Pelvic and perineal pain: Principal | ICD-10-CM

## 2019-01-14 MED ORDER — IBUPROFEN 600 MG TABLET
ORAL_TABLET | Freq: Four times a day (QID) | ORAL | 0 refills | 15.00000 days | Status: CP | PRN
Start: 2019-01-14 — End: 2019-01-14

## 2019-01-14 MED ORDER — IBUPROFEN 600 MG TABLET: 600 mg | tablet | Freq: Four times a day (QID) | 0 refills | 15 days | Status: AC

## 2019-01-24 ENCOUNTER — Ambulatory Visit
Admit: 2019-01-24 | Discharge: 2019-01-26 | Disposition: A | Discharge status: Home / Self Care | Payer: BLUE CROSS/BLUE SHIELD | Attending: Family

## 2019-01-24 ENCOUNTER — Ambulatory Visit
Admit: 2019-01-24 | Discharge: 2019-01-26 | Disposition: A | Discharge status: Home / Self Care | Payer: BLUE CROSS/BLUE SHIELD

## 2019-01-24 DIAGNOSIS — R0609 Other forms of dyspnea: Principal | ICD-10-CM

## 2019-01-24 DIAGNOSIS — J4530 Mild persistent asthma, uncomplicated: Principal | ICD-10-CM

## 2019-01-24 DIAGNOSIS — J45901 Unspecified asthma with (acute) exacerbation: Principal | ICD-10-CM

## 2019-01-24 DIAGNOSIS — J4531 Mild persistent asthma with (acute) exacerbation: Principal | ICD-10-CM

## 2019-01-24 MED ORDER — PREDNISONE 20 MG TABLET
ORAL_TABLET | Freq: Every day | ORAL | 0 refills | 5.00000 days | Status: SS
Start: 2019-01-24 — End: 2019-01-29

## 2019-01-24 MED ORDER — ALBUTEROL SULFATE HFA 90 MCG/ACTUATION AEROSOL INHALER
RESPIRATORY_TRACT | 11 refills | 0.00000 days | Status: SS | PRN
Start: 2019-01-24 — End: 2020-01-24

## 2019-01-24 MED ORDER — FLUTICASONE 250 MCG-SALMETEROL 50 MCG/DOSE BLISTR POWDR FOR INHALATION
Freq: Two times a day (BID) | RESPIRATORY_TRACT | 11 refills | 1.00000 days | Status: SS
Start: 2019-01-24 — End: 2020-01-24

## 2019-01-24 MED ORDER — ALBUTEROL SULFATE 2.5 MG/3 ML (0.083 %) SOLUTION FOR NEBULIZATION
RESPIRATORY_TRACT | 11 refills | 5.00000 days | Status: SS | PRN
Start: 2019-01-24 — End: 2020-01-24

## 2019-01-24 MED ORDER — PREDNISONE 20 MG TABLET: 40 mg | tablet | Freq: Every day | 0 refills | 5 days | Status: AC

## 2019-01-24 MED ORDER — MONTELUKAST 10 MG TABLET
ORAL_TABLET | Freq: Every day | ORAL | 2 refills | 30 days | Status: SS
Start: 2019-01-24 — End: 2020-01-24

## 2019-01-25 MED ORDER — ADVAIR HFA 115 MCG-21 MCG/ACTUATION AEROSOL INHALER
Freq: Two times a day (BID) | RESPIRATORY_TRACT | 11 refills | 30 days | Status: CP
Start: 2019-01-25 — End: 2019-01-25

## 2019-01-25 MED ORDER — BREO ELLIPTA 100 MCG-25 MCG/DOSE POWDER FOR INHALATION
Freq: Every day | RESPIRATORY_TRACT | 0 refills | 30 days | Status: CP
Start: 2019-01-25 — End: 2019-01-25

## 2019-01-26 MED ORDER — SIMETHICONE 40 MG/0.6 ML ORAL DROPS,SUSPENSION
Freq: Four times a day (QID) | ORAL | 0 refills | 13 days | Status: CP | PRN
Start: 2019-01-26 — End: 2019-02-25

## 2019-01-26 MED ORDER — ADVAIR HFA 115 MCG-21 MCG/ACTUATION AEROSOL INHALER
Freq: Two times a day (BID) | RESPIRATORY_TRACT | 0 refills | 0.00000 days | Status: CP
Start: 2019-01-26 — End: ?

## 2019-01-26 MED ORDER — DEXTROMETHORPHAN-GUAIFENESIN 10 MG-100 MG/5 ML ORAL SYRUP
ORAL | 0 refills | 1 days | Status: CP | PRN
Start: 2019-01-26 — End: ?

## 2019-01-27 MED ORDER — PREDNISONE 20 MG TABLET
ORAL_TABLET | Freq: Every day | ORAL | 0 refills | 2 days | Status: CP
Start: 2019-01-27 — End: 2019-01-29

## 2019-01-27 MED ORDER — POLYETHYLENE GLYCOL 3350 17 GRAM ORAL POWDER PACKET
PACK | Freq: Every day | ORAL | 0 refills | 30 days | Status: CP
Start: 2019-01-27 — End: 2019-02-26

## 2019-01-29 MED ORDER — IPRATROPIUM 0.5 MG-ALBUTEROL 3 MG (2.5 MG BASE)/3 ML NEBULIZATION SOLN
Freq: Four times a day (QID) | RESPIRATORY_TRACT | 99 refills | 10.00000 days | Status: CP | PRN
Start: 2019-01-29 — End: 2020-01-29

## 2019-01-29 MED ORDER — FLUTICASONE 500 MCG-SALMETEROL 50 MCG/DOSE BLISTR POWDR FOR INHALATION
Freq: Two times a day (BID) | RESPIRATORY_TRACT | 11 refills | 30 days | Status: CP
Start: 2019-01-29 — End: 2020-01-29

## 2019-01-31 ENCOUNTER — Encounter
Admit: 2019-01-31 | Discharge: 2019-02-01 | Payer: BLUE CROSS/BLUE SHIELD | Attending: Pulmonary Disease | Primary: Pulmonary Disease

## 2019-01-31 ENCOUNTER — Encounter: Admit: 2019-01-31 | Discharge: 2019-02-01 | Payer: BLUE CROSS/BLUE SHIELD

## 2019-01-31 MED ORDER — SERTRALINE 25 MG TABLET
ORAL_TABLET | 2 refills | 0 days | Status: CP
Start: 2019-01-31 — End: ?

## 2019-02-28 ENCOUNTER — Encounter
Admit: 2019-02-28 | Discharge: 2019-03-01 | Payer: BLUE CROSS/BLUE SHIELD | Attending: Pulmonary Disease | Primary: Pulmonary Disease

## 2019-02-28 DIAGNOSIS — J4530 Mild persistent asthma, uncomplicated: Principal | ICD-10-CM

## 2019-02-28 MED ORDER — EPINEPHRINE 0.3 MG/0.3 ML INJECTION, AUTO-INJECTOR
Freq: Once | INTRAMUSCULAR | 11 refills | 2.00000 days | Status: CP
Start: 2019-02-28 — End: 2019-02-28

## 2019-03-07 DIAGNOSIS — J4531 Mild persistent asthma with (acute) exacerbation: Principal | ICD-10-CM

## 2019-03-07 DIAGNOSIS — J4530 Mild persistent asthma, uncomplicated: Principal | ICD-10-CM

## 2019-03-07 DIAGNOSIS — J82 Pulmonary eosinophilia, not elsewhere classified: Principal | ICD-10-CM

## 2019-03-07 MED ORDER — MEPOLIZUMAB 100 MG/ML SUBCUTANEOUS AUTO-INJECTOR
SUBCUTANEOUS | 11 refills | 0.00000 days | Status: CP
Start: 2019-03-07 — End: ?
  Filled 2019-03-25: qty 1, 28d supply, fill #0

## 2019-03-08 DIAGNOSIS — J4530 Mild persistent asthma, uncomplicated: Principal | ICD-10-CM

## 2019-03-08 DIAGNOSIS — J82 Pulmonary eosinophilia, not elsewhere classified: Principal | ICD-10-CM

## 2019-03-11 ENCOUNTER — Encounter
Admit: 2019-03-11 | Discharge: 2019-03-12 | Payer: BLUE CROSS/BLUE SHIELD | Attending: Advanced Practice Midwife | Primary: Advanced Practice Midwife

## 2019-03-11 DIAGNOSIS — F419 Anxiety disorder, unspecified: Principal | ICD-10-CM

## 2019-03-11 MED ORDER — SERTRALINE 25 MG TABLET
ORAL_TABLET | Freq: Every day | ORAL | 0 refills | 30.00000 days | Status: CP
Start: 2019-03-11 — End: 2019-04-10

## 2019-03-11 MED ORDER — IBUPROFEN 600 MG TABLET
ORAL_TABLET | Freq: Four times a day (QID) | ORAL | 0 refills | 15 days | Status: CP | PRN
Start: 2019-03-11 — End: ?

## 2019-03-13 NOTE — Unmapped (Signed)
FN Authorization ER    CPT : Z6109 NUCALA  DOS:NA  PT Class: (IP/OP) OP  Eligibility Verified by: UEA  Primary Payer: [employer]  Status: APPROVED  Approval Dates: 02/27/19--08/25/19  Ref #/Auth [id number]  Contact Name: NA    Secondary INS: NA    Did you speak with the Patient in person or via phone? NA  Did you speak to Patient about access one?

## 2019-03-13 NOTE — Unmapped (Signed)
[facility] Specialty Medication Onboarding    Specialty Medication: NUCALA SYRINGE  Prior Authorization: Approved   Financial Assistance: No - patient doesn't qualify for additional assistance   Final Copay/Day Supply: $33 / 28 DAYS    Insurance Restrictions: Yes - max 1 month supply     Notes to Pharmacist:     The triage team has completed the benefits investigation and has determined that the patient is able to fill this medication at [facility]. Please contact the patient to complete the onboarding or follow up with the prescribing physician as needed.

## 2019-03-16 DIAGNOSIS — J45901 Unspecified asthma with (acute) exacerbation: Principal | ICD-10-CM

## 2019-03-18 MED ORDER — MONTELUKAST 10 MG TABLET
ORAL_TABLET | 1 refills | 0 days | Status: CP
Start: 2019-03-18 — End: ?

## 2019-03-21 DIAGNOSIS — J4530 Mild persistent asthma, uncomplicated: Principal | ICD-10-CM

## 2019-03-21 NOTE — Unmapped (Signed)
[name]   Patient Onboarding/Medication Counseling    Patient will take first injection to clinic along with Epipen RX that has been called into home pharmacy     Dana Jacobs is a 39 y.o. female with mild persistent asthma who I am counseling today on initiation of therapy.  I am speaking to the patient.    Was a Nurse, learning disability used for this call? No    Verified patient's date of birth / HIPAA.    Specialty medication(s) to be sent: CF/Pulmonary: -Nucala 100mg /ml      Non-specialty medications/supplies to be sent: n/a      Medications not needed at this time: n/a             Nucala (mepolizumab)    Medication & Administration     Dosage: Nucala 100mg /ml Auto-injector: Inject 1 ml (100mg ) SQ once every 4 weeks      Administration:     Auto-Injector:  ? Gather all supplies needed for injection on a clean, flat working surface: medication syringe(s) removed from packaging, alcohol swab, sharps container, etc.  ? Look at the medication label ??? look for correct medication, correct dose, and check the expiration date  ? Look at the medication ??? the liquid in the syringe should appear clear and colorless to slightly yellow. Do not use if the solution is cloudy, leaking, or has particles  ? Lay the syringe on a flat surface and allow it to warm up to room temperature for at least 30 minutes  ? Select injection site ??? you can use the front of your thigh or your belly (but not the area 2 inches around your belly button); if someone else is giving you the injection you can also use your upper arm in the skin covering your triceps muscle  ? Prepare injection site ??? wash your hands and clean the skin at the injection site with an alcohol swab and let it air dry, do not touch the injection site again before the injection  ? Pull off the needle safety cap, do not remove until immediately prior to injection; Inject within 5 minutes of removing the clear needle cap. Do not touch the yellow needle guard or put the cap back on.   ? With the inspection window facing you, press auto-injector straight on injection site. Hold it down and push it down against skin. This will make yellow needle guard slide up   ? You should hear a click to let you know injection has started   ? The yellow indicator will move down through inspection window as you are completing your dose   ? It may take 15 seconds to get the full dose   ? Continue holding until you hear the second click. The inspection window should be filled with the yellow indicator   ? Continue to hold for another 5 seconds after you hear the 2nd click   ? Dispose of the used syringe immediately in your sharps disposal container, do not attempt to recap the needle prior to disposing  ? If you see any blood at the injection site, press a cotton ball or gauze on the site and maintain pressure until the bleeding stops, do not rub the injection site      Adherence/Missed dose instructions:  Take missed dose as soon as you remember. If it is close to the time of your next dose, skip the dose and resume with your next scheduled dose.    Goals  of Therapy     Control asthma and help reduce frequency of exacerbations and use of oral steroids    Side Effects & Monitoring Parameters     ? Headache   ? Back pain   ? Feeling tired or weak   ? Redness or swelling at injection site   ? Pain or burning where the drug was used    The following side effects should be reported to the provider:  ? Signs of allergic reaction such as rash, hives, itching, red, swollen, blistered, or peeling skin, trouble breathing or swallowing, swelling of the mouth, face, lips, tongue, or throat   ? Dizziness or passing out   ? Very bad headache   ? Flushing   ? Feeling hot or cold   ? Shortness of breath      Contraindications, Warnings, & Precautions     ? Hypersensitivity to mepolizumab or any components of the formualy    Drug/Food Interactions     ? Medication list reviewed in Epic. The patient was instructed to inform the care team before taking any new medications or supplements. No drug interactions identified.   .     Storage, Handling Precautions, & Disposal   ? Prefilled autoinjector/syringe: Store at 2??C to 8??C (36??F to 46??F). Do not freeze; protect from light. Do not shake. An unopened carton may be stored at ?30??C (86??F) for ?7 days; discard if kept at room temperature for >7 days. Once autoinjector/syringe is removed from carton must administer within 8 hours; otherwise discard.      Current Medications (including OTC/herbals), Comorbidities and Allergies     Current Outpatient Medications   Medication Sig Dispense Refill   ??? albuterol 2.5 mg /3 mL (0.083 %) nebulizer solution Inhale 3 mL (2.5 mg total) by nebulization every four (4) hours as needed for wheezing. 30 vial 11   ??? albuterol HFA 90 mcg/actuation inhaler Inhale 2 puffs every four (4) hours as needed for wheezing. 2 Inhaler 11   ??? benzonatate (TESSALON) 100 MG capsule TAKE 1 TO 2 CAPSULES BY MOUTH THREE TIMES DAILY     ??? dextromethorphan-guaifenesin (ROBITUSSIN-DM) 10-100 mg/5 mL liquid Take 5 mL by mouth every four (4) hours as needed. 30 mL 0   ??? EPINEPHrine (EPIPEN 2-PAK) 0.3 mg/0.3 mL injection Inject 0.3 mL (0.3 mg total) into the muscle once for 1 dose. 2 each 11   ??? fluticasone propion-salmeteroL (ADVAIR) 500-50 mcg/dose diskus Inhale 1 puff Two (2) times a day. 60 each 11   ??? ibuprofen (MOTRIN) 600 MG tablet Take 1 tablet (600 mg total) by mouth every six (6) hours as needed. 60 tablet 0   ??? ipratropium-albuteroL (DUO-NEB) 0.5-2.5 mg/3 mL nebulizer Inhale 3 mL by nebulization every six (6) hours as needed. 120 mL 99   ??? loratadine (CLARITIN) 10 mg tablet Take 1 tablet (10 mg total) by mouth daily. (Patient not taking: Reported on 03/11/2019) 30 tablet 11   ??? mepolizumab 100 mg/mL AtIn Inject the contents of 1 syringe (100 mg) under the skin every twenty-eight (28) days. 1 mL 11   ??? montelukast (SINGULAIR) 10 mg tablet Take 1 tablet by mouth once daily 90 tablet 1   ??? sertraline (ZOLOFT) 25 MG tablet Take 2 tablets (50 mg total) by mouth daily for 30 doses. 60 tablet 0     No current facility-administered medications for this visit.        No Known Allergies    Patient Active Problem List  Diagnosis   ??? Red blood cell antibody positive   ??? S/P hysterectomy   ??? Mild persistent asthma with acute exacerbation   ??? Anxiety   ??? Mild persistent allergic asthma       Reviewed and up to date in Epic.    Appropriateness of Therapy     Is medication and dose appropriate based on diagnosis? Yes    Prescription has been clinically reviewed: Yes    Baseline Quality of Life Assessment      How many days over the past month did your asthma  keep you from your normal activities? For example, brushing your teeth or getting up in the morning. Patient declined to answer    Financial Information     Medication Assistance provided: Prior Authorization    Anticipated copay of $33 reviewed with patient. Verified delivery address.    Delivery Information     Scheduled delivery date: 3/8    Expected start date: not sure, needs to make clinic appt    Medication will be delivered via Same Day Courier to the prescription address in [facility].  This shipment will not require a signature.      Explained the services we provide at [facility] and that each month we would call to set up refills.  Stressed importance of returning phone calls so that we could ensure they receive their medications in time each month.  Informed patient that we should be setting up refills 7-10 days prior to when they will run out of medication.  A pharmacist will reach out to perform a clinical assessment periodically.  Informed patient that a welcome packet and a drug information handout will be sent.      Patient verbalized understanding of the above information as well as how to contact the pharmacy at [phone] option 4 with any questions/concerns.  The pharmacy is open Monday through Friday 8:30am-4:30pm.  A pharmacist is available 24/7 via pager to answer any clinical questions they may have.    Patient Specific Needs     ? Does the patient have any physical, cognitive, or cultural barriers? No    ? Patient prefers to have medications discussed with  Patient     ? Is the patient or caregiver able to read and understand education materials at a high school level or above? Yes    ? Patient's primary language is  English     ? Is the patient high risk? No     ? Does the patient require a Care Management Plan? No     ? Does the patient require physician intervention or other additional services (i.e. nutrition, smoking cessation, social work)? No      Dana Jacobs  [facility] Specialty Pharmacist

## 2019-03-25 MED FILL — NUCALA 100 MG/ML SUBCUTANEOUS AUTO-INJECTOR: 28 days supply | Qty: 1 | Fill #0 | Status: AC

## 2019-03-31 NOTE — Unmapped (Deleted)
[facility] PELVIC HEALTH [facility]  OUTPATIENT PHYSICAL THERAPY  04/01/2019          Patient Name: Dana Jacobs  Date of Birth:13-Aug-1980  Diagnosis: No diagnosis found.  Referring MD:  Al Corpus, MD     Plan of Care Effective Date:   [email or web address]       Assessment & Plan         Impairments: bowel dysfunction, core weakness, impaired coordination, pain, poor bowel/bladder habits, postural weakness, urinary incontinence, urinary dysfunction and impaired tone                                Therapy Goals  Goals: Patient will participate in an internal pelvic floor assessment to further rule in/out pelvic floor involvement.   B levator ani, obturator internus, coccygeus & piriformis spasm eliminated, </= 2/10 TTP for ***.  Levator ani strength >/= ***/5 with >/= *** second hold followed by complete relaxation for improved pelvic floor muscle strength and coordination, for improved closure of sphincters to prevent incontinence.  Patient able to bear down with appropriate pelvic floor muscle relaxation/descent for improved evacuation with defecation.  Patient will demonstrate connective tissue mobility WNLs for decreased pain and improved return to PLOF.   Levator ani contraction prior to cough/laugh/sneeze/jump to prevent stress urinary incontinence ***% of time.  Patient independent with urge inhibition strategy to decrease urge urinary incontinence.  Urinary incontinence prevented ***% of time for improved quality of life, skin integrity, and return to prior level of function.  Bowel movements classified as type III-IV on Bristol Stool Chart for improved stool consistency and improved ability to prevent fecal incontinence.  Constipation eliminated for improved pelvic floor muscle function and continued pelvic floor muscle stability.  2-digit vaginal insertion with </= ***/10 pain for improved ability to tolerate vaginal exam/intercourse.  Hip ER strength 5/5 for improved femoroacetabular stability and decreased strain on obturator internus during weight bearing activities.  Trendelenburg with single limb stance eliminated for improved LE alignment with ambulation and ascending/descending stairs for continued pelvic floor muscle stability.  Patient able to sit on hard surface for *** minutes with </= ***/10 pain for improved ability to ***.  Patient able to walk for *** minutes with </= ***/10 pain for improved ability to ***.  Pt independent with HEP for self-management of sxs.        Plan  Therapy options: will be seen for skilled physical therapy services    Planned therapy interventions: manual therapy, Bladder retraining, body mechanics training, neuromuscular re-education, Bowel retraining, postural training, self-care / home training, Diaphragmatic/Pursed-lip breathing, education - patient, therapeutic activities, therapeutic exercises, Ultrasound and home exercise program        Frequency: 1x week    Duration in weeks: 12 weeks decreasing frequency as able    Education provided to: patient.  Education provided: anatomy, body awareness, body mechanics, HEP and treatment options and plan  Education Results: verbalized good understanding.  Communication/Consultation: n/a.                Subjective    OB/GYN History:                            Sexual History:                    Urinary Symptoms:  Bowel Symptoms:                                              Objective        I attest that I have reviewed the above information.  Signed: Lajean Silvius PT, DPT, [facility]  03/31/19 1:19 PM      All information regarding the expectations of participation in pelvic floor physical therapy, including but not limited to, the need for internal and/or external pelvic floor assessment, treatment techniques and plan of care were thoroughly discussed.  Patient confirms understanding that (s)he may bring a third party to any or all physical therapy sessions and can withdraw consent at any point throughout the plan of care.  Patient and guardian (if applicable) verbalize understanding of all above information and consent to pelvic floor assessment and treatment at evaluation and all future physical therapy sessions.      I spent *** minutes on the {phone audio video visit:[id number]} with the patient on the date of service. I spent an additional *** minutes on pre- and post-visit activities on the date of service.     The patient was physically located in [place] or a state in which I am permitted to provide care. The patient and/or parent/guardian understood that s/he may incur co-pays and cost sharing, and agreed to the telemedicine visit. The visit was reasonable and appropriate under the circumstances given the patient's presentation at the time.    The patient and/or parent/guardian has been advised of the potential risks and limitations of this mode of treatment (including, but not limited to, the absence of in-person examination) and has agreed to be treated using telemedicine. The patient's/patient's family's questions regarding telemedicine have been answered.     If the visit was completed in an ambulatory setting, the patient and/or parent/guardian has also been advised to contact their provider???s office for worsening conditions, and seek emergency medical treatment and/or call 911 if the patient deems either necessary.

## 2019-04-01 NOTE — Unmapped (Signed)
[facility] OFFICE BUILDING  [address]                                   [place], [place] [address]    [phone]    Dana Jacobs sent Mychart message to cancel her  scheduled Physical Therapy Evaluation due to insurance coverage issues.  Please contact me if you have any questions or concerns.    Thank you for this referral,     Signed: Lajean Silvius PT, DPT, [facility]  04/01/19 2:33 PM

## 2019-04-05 ENCOUNTER — Encounter: Admit: 2019-04-05 | Discharge: 2019-04-06 | Payer: BLUE CROSS/BLUE SHIELD

## 2019-04-05 NOTE — Unmapped (Signed)
Pt arrived to clinic today for Nucala injection. She was given instruction on the medication and how to use the auto-injector. She was able to provide understanding using the teach back method. Pt was also educated on how to the epi pen. Pt was able to provide understanding using the teach back method as well.     VSS and allergies review. Pt was able to administer the medication in the LLQ of her abd herself. No complications noted during administration.     Pt c/o of being sore at the injection site afterwards, and felt slightly lightheaded which she stated may have been the lighting. Lighting in the room was dimmed and patient was made comfortable. No further concerns noted.     Pt was provided with a calender covering a couple of months for her next injections.     Pt is able to leave around 4:20. No complications noted during stay.

## 2019-04-07 DIAGNOSIS — F419 Anxiety disorder, unspecified: Principal | ICD-10-CM

## 2019-04-08 MED ORDER — SERTRALINE 25 MG TABLET
ORAL_TABLET | Freq: Every day | ORAL | 0 refills | 30 days | Status: CP
Start: 2019-04-08 — End: 2019-05-08

## 2019-04-09 NOTE — Unmapped (Signed)
Pt called clinic nurse line requesting a call back.    Reached out to pt, but call dropped.   Her number: [phone]

## 2019-04-11 ENCOUNTER — Encounter: Admit: 2019-04-11 | Discharge: 2019-04-12 | Payer: BLUE CROSS/BLUE SHIELD

## 2019-04-11 DIAGNOSIS — D229 Melanocytic nevi, unspecified: Principal | ICD-10-CM

## 2019-04-11 DIAGNOSIS — D485 Neoplasm of uncertain behavior of skin: Principal | ICD-10-CM

## 2019-04-11 NOTE — Unmapped (Signed)
[facility] Skin/Procedure Clinic Consult Note      SUBJECTIVE:         History of Present Illness    Dana Jacobs is a 39 y.o. female, who is seen in consultation at the request of Galen Manila Lesia Sago, MD for the evaluation of skin lesions.  Patient reports:    ##   Skin lesion, mid-back  ?? Slowly growing, irritated mole  ?? Rubs her bra strap  ?? Has had a couple moles removed in the past - all have come back benign    ##   Skin lesion, mid chest  ?? Similar to above  ?? Growing slowly, a bit darker  ?? Also irritated due to location  ?? No bleeding, crust or ulceration    ##   Multiple nevi, face and trunk  ?? No recent changes  ?? She watches those she can see (front of body)    ##   Skin, prevention  ?? Significant sun exposure in the past, including sunburns  ?? Better with sun protection and consistent use of sunscreen now, but still has significant exposure  ?? No personal or FH of skin cancer    Patient denies fever, chills, diaphoresis, or malaise.  Patient denies mucosal lesions, joint inflammation, or lymphadenopathy.      Allergies  Patient has no known allergies.    Medications   Current Outpatient Medications   Medication Sig Dispense Refill   ??? albuterol 2.5 mg /3 mL (0.083 %) nebulizer solution Inhale 3 mL (2.5 mg total) by nebulization every four (4) hours as needed for wheezing. 30 vial 11   ??? albuterol HFA 90 mcg/actuation inhaler Inhale 2 puffs every four (4) hours as needed for wheezing. 2 Inhaler 11   ??? benzonatate (TESSALON) 100 MG capsule TAKE 1 TO 2 CAPSULES BY MOUTH THREE TIMES DAILY     ??? dextromethorphan-guaifenesin (ROBITUSSIN-DM) 10-100 mg/5 mL liquid Take 5 mL by mouth every four (4) hours as needed. 30 mL 0   ??? fluticasone propion-salmeteroL (ADVAIR) 500-50 mcg/dose diskus Inhale 1 puff Two (2) times a day. 60 each 11   ??? ibuprofen (MOTRIN) 600 MG tablet Take 1 tablet (600 mg total) by mouth every six (6) hours as needed. 60 tablet 0   ??? ipratropium-albuteroL (DUO-NEB) 0.5-2.5 mg/3 mL nebulizer Inhale 3 mL by nebulization every six (6) hours as needed. 120 mL 99   ??? loratadine (CLARITIN) 10 mg tablet Take 1 tablet (10 mg total) by mouth daily. 30 tablet 11   ??? mepolizumab 100 mg/mL AtIn Inject the contents of 1 syringe (100 mg) under the skin every twenty-eight (28) days. 1 mL 11   ??? montelukast (SINGULAIR) 10 mg tablet Take 1 tablet by mouth once daily 90 tablet 1   ??? sertraline (ZOLOFT) 25 MG tablet Take 2 tablets (50 mg total) by mouth daily for 30 doses. 60 tablet 0   ??? EPINEPHrine (EPIPEN 2-PAK) 0.3 mg/0.3 mL injection Inject 0.3 mL (0.3 mg total) into the muscle once for 1 dose. 2 each 11     No current facility-administered medications for this visit.        Past Medical History  Patient Active Problem List   Diagnosis   ??? Red blood cell antibody positive   ??? S/P hysterectomy   ??? Mild persistent asthma with acute exacerbation   ??? Anxiety   ??? Mild persistent allergic asthma       Past Surgical History  Past Surgical  History:   Procedure Laterality Date   ??? HERNIA REPAIR      umbilical hernia   ??? HYSTERECTOMY     ??? PR LAPAROSCOPY W TOT HYSTERECTUTERUS <=250 GRAM  W TUBE/OVARY Bilateral 07/06/2018    Procedure: Exam under anesthesia, total laparoscopic hysterectomy, bilateral salpingectomy,  cystoscopy;  Surgeon: Dorris Carnes, MD;  Location: OR [facility];  Service: Gynecology   ??? PR SIGMOIDOSCOPY W/ENDOSCOPIC US EXAM N/A 03/15/2018    Procedure: SIGMOIDOSCOPY, FLEXIBLE, WITH ENDOSCOPIC ULTRASOUND EXAM;  Surgeon: Chriss Driver, MD;  [facility];  Service: Gastroenterology   ??? WISDOM TOOTH EXTRACTION         Family History  Family History   Problem Relation Age of Onset   ??? Crohn's disease Mother    ??? Breast cancer Neg Hx    ??? Ovarian cancer Neg Hx        Social History  Denies current tobacco use    Review of Systems    The following systems were reviewed and found to be negative: Constitutional and Respiratory.  See HPI for other pertinent ROS.            OBJECTIVE:     Physical Exam  Vitals: BP 134/103  - Pulse 86  - Temp 36.6 ??C (97.8 ??F) (Temporal)  - Wt 62.5 kg (137 lb 12.8 oz)  - LMP 06/06/2018 (Approximate)  - BMI 25.20 kg/m??   General Apperance: no obvious deformities, comfortable and no acute distress  Head / Face: no deformities or dysmorphism  Eyes: pink conjunctivae, anicteric sclerae, lids clear  ENT / Mouth: ears and nose without external deformities, hearing grossly intact  Neck: neck symmetric, trachea midline  Respiratory: normal respiratory effort   Cardiovascular: no edema  Gastrointestinal: no noticeable hernia  Lymphatic: no cervical adenopathy  Extremities: no deformities, no nail changes  Psychiatric: normal mood, appropriate affect   Skin: examination of the scalp, head/face, neck, chest/breast, axillae, abdomen, genitalia/groin, buttocks, back, and bilateral upper and lower extremities notable for:  ? Mid-back: 5mm regular, dark Mchaney papule w/o atypical features  ? Mid-chest: 5mm regular, dark Fearn papule w/o atypical features  ? Face and trunk: multiple regular Trevizo macules and papules w/o atypical features  No hyperhidrosis, chromhidrosis or bromhidrosis      Diagnostic Data  Labs / Tests:    Results for orders placed or performed during the hospital encounter of 04/05/19   Flow volume loops pre/post   Result Value Ref Range    FVC POST 3.19 2.30 - 3.61 L    FVC PRE 3.10 2.30 - 3.61 L    FEV1 POST 2.28 1.90 - 2.98 L    FEV1 PRE 1.69 (L) 1.90 - 2.98 L    FEV1/FVC POST 71.67 (L) 72.51 - 92.19 %    FEV1/FVC PRE 54.46 (L) 72.51 - 92.19 %    FEV6 POST 3.13 2.16 - 3.47 L    FEV6 PRE 2.93 2.16 - 3.47 L    FEV1/FEV6 POST 72.91 (L) 75.32 - 94.99 %    FEV1/FEV6 PRE 57.61 (L) 75.32 - 94.99 %    FEF25-75% POST 1.64 1.47 - 4.27 L/s    FEF25-75% PRE 0.79 (L) 1.47 - 4.27 L/s    ISOFEF25-75 POST 1.80 L/s    ISOFEF25-75 PRE 0.79 L/s    FEF50% POST 1.96 (L) 2.23 - 5.85 L/s    FEF50% PRE 0.96 (L) 2.23 - 5.85 L/s    PEF POST 5.72 4.44 - 8.20  L/s    PEF PRE 4.15 (L) 4.44 - 8.20 L/s    FET100% POST 9.12 sec    FET100% Change 9.21 sec    FIVC POST 2.90 2.30 - 3.61 L    FIVC PRE 2.93 2.30 - 3.61 L    FIF50% POST 4.55 L/s    FIF50% PRED 5.47 L/s    ZOX/WRU04 post 35.89 %    VWU/JWJ19 pre 17.47 %    PIF POST 5.51 3.15 - 7.95 L/s    PIF PRE 5.53 3.15 - 7.95 L/s    VOL extrap post 0.08 L    Vol extrap pre 0.05 L     Imaging:  Radiology studies were personally reviewed          ASSESSMENT / PLAN:          39 y.o. female, here for:     ##   Neoplasm of uncertain behavior of skin, mid-back  ?? Likely an irritated dermal nevus  ?? Given evolution of this nevus, shave bx performed (see procedure note below)    ##   Neoplasm of uncertain behavior of skin, mid-chest  ?? Likely an irritated dermal nevus  ?? Given evolution of this nevus, shave bx performed (see procedure note below)    ##   Multiple benign nevi, face and trunk  ?? No atypical features  ?? Reviewed ABCDE  ?? Return if any nevus changes in a meaningful way  ?? Self monitoring at home    ##   Preventive, Skin  ?? discussed sun damage and its association with skin cancers  ?? encouraged sun avoidance, sun protection and regular use of sunscreen      Shave Biopsy Procedure Note    Indication(s): Skin neoplasm of uncertain behavior    Primary/Referring Provider:  Adelina Mings, CNM     Attending Physician:  Rosary Lively. Karlin Binion, MD    Pre-operative Diagnosis: Skin neoplasm of uncertain behavior  Post-operative Diagnosis: same    Location(s):   A. Mid-back  B. Mid-chest    Allergies:  reviewed allergy section in the chart    Consent:    I reviewed the risks (bleeding, infection, scarring, poor cosmetic result), benefits (accurate diagnosis of lesion, removal of lesion), and alternatives (observation) to proposed procedure.  I answered all of the patient's questions and addressed all concerns.  Patient agreed to proceed.  Verbal and written consent obtained; form scanned to chart.      Time-out:  Performed immediately prior to procedure      Anesthesia: Lidocaine 1% with epinephrine with added sodium bicarbonate    Procedure Details:   The lesion and surrounding area were prepped with chlorhexidine.  Using G#27 1-1/2 needle, 1 mL of anesthetic was injected to dermal area causing each lesion to elevate slightly.  Two lesions were sharply excised using slightly bowed, flexible single-edge razor blade  that was kept parallel to the skin.  Simple pressure and aluminum chloride were applied to biopsy site for hemostasis.  Topical antibiotics and dressing were then applied.  The patient tolerated the procedure well.      Specimen(s): The specimen was sent for pathologic examination.      EBL: <8mL    Condition: Stable    Complications:  None    Post-operative Instructions and Plan:  ?? Keep wound dry for 24 hours.  ?? If bleeding occurs, apply pressure x 15-66mins or compression (eg, Ace wraps), elevate, &/or apply ice.  If bleeding persists, call clinic for  further instructions.  ?? Apply petrolatum twice daily to keep wound moist.  ?? Keep wound covered with dressing, esp. at night to avoid drying.  ?? Call or return if signs of wound infection ensue (pain, purulent drainage, significant crusting, advancing redness, increasing warmth).  ?? Use ice and OTC acetaminophen as needed for pain.   ?? Will release biopsy results (electronically or by mail or phone) and any further recommendations (if any) based on pathology findings.

## 2019-04-14 ENCOUNTER — Ambulatory Visit: Attending: Internal Medicine

## 2019-04-14 DIAGNOSIS — Z23 Encounter for immunization: Secondary | ICD-10-CM

## 2019-04-14 NOTE — Progress Notes (Signed)
   Covid-19 Vaccination Clinic  Name:  Laurie Briggs    MRN: [id number] DOB: April 15, 1980  04/14/2019  [name] was observed post Covid-19 immunization for 15 minutes without incident. She was provided with Vaccine Information Sheet and instruction to access the V-Safe system.   [name] was instructed to call 911 with any severe reactions post vaccine: Marland Kitchen Difficulty breathing  . Swelling of face and throat  . A fast heartbeat  . A bad rash all over body  . Dizziness and weakness   Immunizations Administered    Name Date Dose VIS Date Route   Pfizer COVID-19 Vaccine 04/14/2019  2:33 PM 0.3 mL 12/28/2018 Intramuscular   Manufacturer: [employer], [employer]   Lot: [id number]   NDC: [address]-8

## 2019-04-18 NOTE — Unmapped (Signed)
[facility] Clinical Assessment & Refill Coordination Note    Dana Jacobs, DOB: [birth year]  Phone: [phone] (home)     All above HIPAA information was verified with patient.     Was a Nurse, learning disability used for this call? No    Specialty Medication(s):   CF/Pulmonary: -Nucala 100mg /ml     Current Outpatient Medications   Medication Sig Dispense Refill   ??? albuterol 2.5 mg /3 mL (0.083 %) nebulizer solution Inhale 3 mL (2.5 mg total) by nebulization every four (4) hours as needed for wheezing. 30 vial 11   ??? albuterol HFA 90 mcg/actuation inhaler Inhale 2 puffs every four (4) hours as needed for wheezing. 2 Inhaler 11   ??? benzonatate (TESSALON) 100 MG capsule TAKE 1 TO 2 CAPSULES BY MOUTH THREE TIMES DAILY     ??? dextromethorphan-guaifenesin (ROBITUSSIN-DM) 10-100 mg/5 mL liquid Take 5 mL by mouth every four (4) hours as needed. 30 mL 0   ??? EPINEPHrine (EPIPEN 2-PAK) 0.3 mg/0.3 mL injection Inject 0.3 mL (0.3 mg total) into the muscle once for 1 dose. 2 each 11   ??? fluticasone propion-salmeteroL (ADVAIR) 500-50 mcg/dose diskus Inhale 1 puff Two (2) times a day. 60 each 11   ??? ibuprofen (MOTRIN) 600 MG tablet Take 1 tablet (600 mg total) by mouth every six (6) hours as needed. 60 tablet 0   ??? ipratropium-albuteroL (DUO-NEB) 0.5-2.5 mg/3 mL nebulizer Inhale 3 mL by nebulization every six (6) hours as needed. 120 mL 99   ??? loratadine (CLARITIN) 10 mg tablet Take 1 tablet (10 mg total) by mouth daily. 30 tablet 11   ??? mepolizumab 100 mg/mL AtIn Inject the contents of 1 syringe (100 mg) under the skin every twenty-eight (28) days. 1 mL 11   ??? montelukast (SINGULAIR) 10 mg tablet Take 1 tablet by mouth once daily 90 tablet 1   ??? sertraline (ZOLOFT) 25 MG tablet Take 2 tablets (50 mg total) by mouth daily for 30 doses. 60 tablet 0     No current facility-administered medications for this visit.         Changes to medications: Agape reports no changes at this time.    No Known Allergies    Changes to allergies: No    SPECIALTY MEDICATION ADHERENCE     Nucala  100 mg/ml: 0 days of medicine on hand            Specialty medication(s) dose(s) confirmed: Regimen is correct and unchanged.     Are there any concerns with adherence? No    Adherence counseling provided? Not needed    CLINICAL MANAGEMENT AND INTERVENTION      Clinical Benefit Assessment:    Do you feel the medicine is effective or helping your condition? Yes    Clinical Benefit counseling provided? Not needed    Adverse Effects Assessment:    Are you experiencing any side effects? No    Are you experiencing difficulty administering your medicine? No    Quality of Life Assessment:    How many days over the past month did your mild persistent asthma  keep you from your normal activities? For example, brushing your teeth or getting up in the morning. Patient declined to answer    Have you discussed this with your provider? Not needed    Therapy Appropriateness:    Is therapy appropriate? Yes, therapy is appropriate and should be continued    DISEASE/MEDICATION-SPECIFIC INFORMATION      For  patients on injectable medications: Patient currently has 0 doses left.  Next injection is scheduled for 4/16.    PATIENT SPECIFIC NEEDS     ? Does the patient have any physical, cognitive, or cultural barriers? No    ? Is the patient high risk? No     ? Does the patient require a Care Management Plan? No     ? Does the patient require physician intervention or other additional services (i.e. nutrition, smoking cessation, social work)? No      SHIPPING     Specialty Medication(s) to be Shipped:   General Specialty: Nucala 100mg /ml    Other medication(s) to be shipped: n/a     Changes to insurance: No    Delivery Scheduled: Yes, Expected medication delivery date: 4/7.     Medication will be delivered via Same Day Courier to the confirmed prescription address in [facility].    The patient will receive a drug information handout for each medication shipped and additional FDA Medication Guides as required.  Verified that patient has previously received a Conservation officer, historic buildings.    All of the patient's questions and concerns have been addressed.    Julianne Rice   [facility] Specialty Pharmacist

## 2019-04-24 MED FILL — NUCALA 100 MG/ML SUBCUTANEOUS AUTO-INJECTOR: SUBCUTANEOUS | 28 days supply | Qty: 1 | Fill #1

## 2019-04-24 MED FILL — NUCALA 100 MG/ML SUBCUTANEOUS AUTO-INJECTOR: 28 days supply | Qty: 1 | Fill #1 | Status: AC

## 2019-05-05 ENCOUNTER — Ambulatory Visit: Attending: Internal Medicine

## 2019-05-05 DIAGNOSIS — Z23 Encounter for immunization: Secondary | ICD-10-CM

## 2019-05-05 NOTE — Progress Notes (Signed)
   Covid-19 Vaccination Clinic  Name:  Ajanae Virag    MRN: [id number] DOB: 09-18-1980  05/05/2019  Ms. Beed was observed post Covid-19 immunization for 15 minutes without incident. She was provided with Vaccine Information Sheet and instruction to access the V-Safe system.   Ms. Scalici was instructed to call 911 with any severe reactions post vaccine: Marland Kitchen Difficulty breathing  . Swelling of face and throat  . A fast heartbeat  . A bad rash all over body  . Dizziness and weakness   Immunizations Administered    Name Date Dose VIS Date Route   Pfizer COVID-19 Vaccine 05/05/2019  1:30 PM 0.3 mL 12/28/2018 Intramuscular   Manufacturer: [employer], [employer]   Lot: [id number]   NDC: [address]-4

## 2019-05-10 DIAGNOSIS — J45901 Unspecified asthma with (acute) exacerbation: Principal | ICD-10-CM

## 2019-05-17 NOTE — Unmapped (Signed)
[facility] Refill Coordination Note    Specialty Medication(s) to be Shipped:   CF/Pulmonary: -Nucala 100mg /ml     Dana Jacobs, DOB: 06/25/1980  Phone: [phone] (home)     All above HIPAA information was verified with patient.     Was a Nurse, learning disability used for this call? No    Completed refill call assessment today to schedule patient's medication shipment from the [facility] [phone]).       Specialty medication(s) and dose(s) confirmed: Regimen is correct and unchanged.   Changes to medications: Dana Jacobs reports no changes at this time.  Changes to insurance: No  Questions for the pharmacist: No    Confirmed patient received Welcome Packet with first shipment. The patient will receive a drug information handout for each medication shipped and additional FDA Medication Guides as required.       DISEASE/MEDICATION-SPECIFIC INFORMATION        For patients on injectable medications: Patient currently has 0 doses left.  Next injection is scheduled for 06/02/2019 ( PT was not 100% sure.).    SPECIALTY MEDICATION ADHERENCE     Medication Adherence    Patient reported X missed doses in the last month: 0  Specialty Medication: Nucala 100mg /ml  Patient is on additional specialty medications: No  Informant: patient  Reliability of informant: reliable        Nucala 100mg /ml : 0 days of medicine on hand     SHIPPING     Shipping address confirmed in Epic.     Delivery Scheduled: Yes, Expected medication delivery date: 05/27/2019.     Medication will be delivered via Same Day Courier to the prescription address in Epic [facility].    Dana Jacobs P Allena Katz   [facility] Specialty Technician

## 2019-05-27 NOTE — Unmapped (Signed)
The [facility] has received the prescription(s) for NUCALA. The triage team has completed the benefits investigation and has determined that the patient is NOT able to fill this medication at the [facility] due to insurance plan limitations. Please see additional information below and re-route the prescription to the preferred pharmacy. Thank you.    PA Required: No  Specialty Pharmacy Required: [facility] -- Phone: [phone] Fax: [phone]    Chrishawna Farina 's Nucala shipment will be canceled  as a result of no longer being eligible to fill at [facility] pharmacy.     I have reached out to the patient and communicated the delivery change. We will not reschedule the medication due to no longer covered at [facility] and have removed this/these medication(s) from the work request.  We have canceled this work request.

## 2019-05-29 IMAGING — DX PORTABLE CHEST - 1 VIEW
1 series · 1 of 1 positions shown · non-contrast
Comparison: 07/02/2017 chest radiographs.

CLINICAL DATA: 38-year-old female with acute shortness of breath,
asthma attack.

EXAM:
PORTABLE CHEST 1 VIEW

[chest ap]
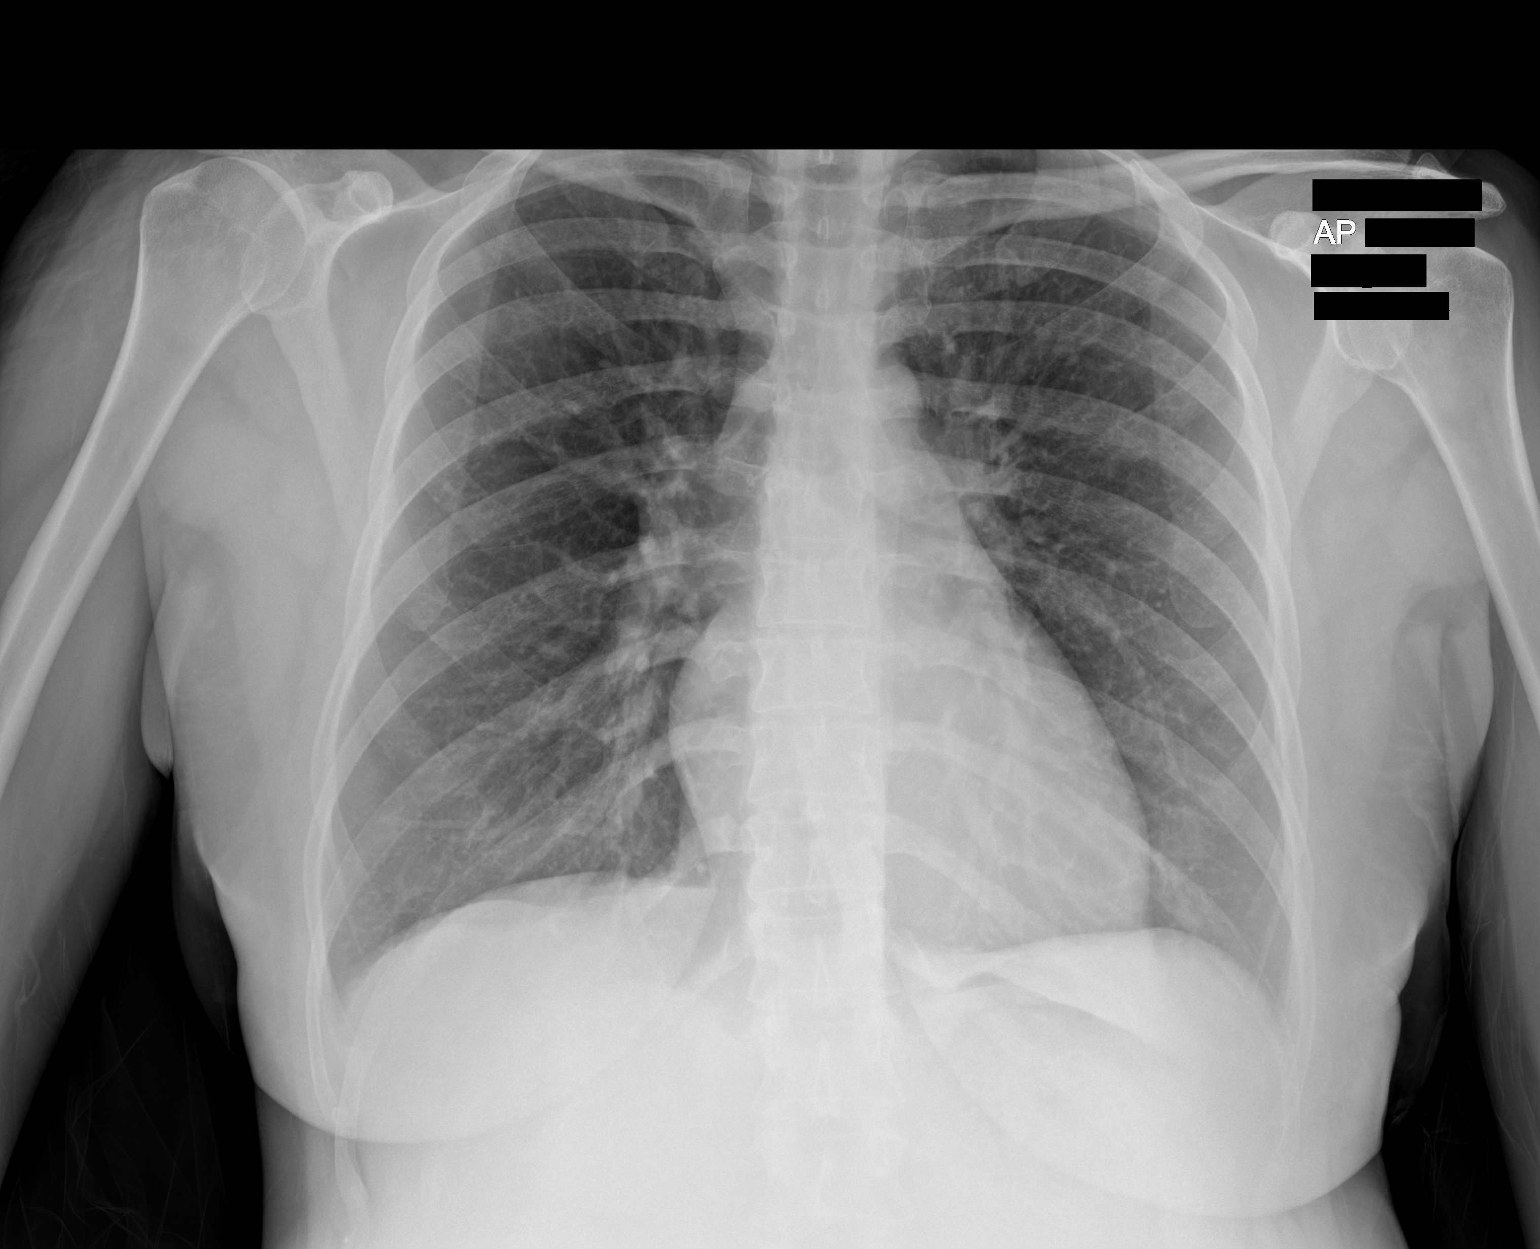

[1 of 1 positions shown; findings below may reference images not displayed]

FINDINGS: Portable AP upright view at 7335 hours. Lung volumes and mediastinal
contours are within normal limits. Allowing for portable technique
the lungs are clear. Visualized tracheal air column is within normal
limits. Negative visible osseous structures. Paucity of bowel gas in
the upper abdomen.
IMPRESSION: Negative portable chest.

## 2019-05-31 DIAGNOSIS — R062 Wheezing: Principal | ICD-10-CM

## 2019-05-31 MED ORDER — ADVAIR HFA 115 MCG-21 MCG/ACTUATION AEROSOL INHALER
0 refills | 0 days
Start: 2019-05-31 — End: ?

## 2019-05-31 NOTE — Unmapped (Signed)
Pharmacy Refill Request  [facility]    Last OV:   02/28/2019    Upcoming appointment:   N/A    Medication was last discontinued by Dr. Judeth Horn. Routing to Dr. Judeth Horn for refill.

## 2019-06-14 NOTE — Unmapped (Signed)
Patient can not fill her Nucala here at [facility], due to high co-pay. Patient's insurance is mandating patient to use [facility] for her Nucala prescriptions. Patient's co-pay here at [facility] would be $2892.52 per month, But may possibly be lower if patient fills at [facility].

## 2019-06-19 NOTE — Unmapped (Signed)
Patient can not fill her Nucala here at [facility], due to high co-pay. Patient's insurance is mandating patient to use [facility] for her Nucala prescriptions. Patient's co-pay here at [facility] would be $2892.52 per month, But may possibly be lower if patient fills at [facility].

## 2019-06-27 DIAGNOSIS — J4530 Mild persistent asthma, uncomplicated: Principal | ICD-10-CM

## 2019-06-27 MED ORDER — NUCALA 100 MG/ML SUBCUTANEOUS AUTO-INJECTOR
SUBCUTANEOUS | 11 refills | 28 days | Status: CP
Start: 2019-06-27 — End: ?

## 2019-06-28 NOTE — Unmapped (Signed)
This patient has been disenrolled from the [facility] specialty pharmacy services due to a pharmacy change. The patient is now filling at [facility].    Dana Jacobs  [facility] Specialty Pharmacist

## 2019-08-12 MED ORDER — NUCALA 100 MG/ML SUBCUTANEOUS AUTO-INJECTOR
SUBCUTANEOUS | 11 refills | 28.00000 days
Start: 2019-08-12 — End: ?

## 2019-08-19 DIAGNOSIS — J4530 Mild persistent asthma, uncomplicated: Principal | ICD-10-CM

## 2019-08-19 MED ORDER — NUCALA 100 MG/ML SUBCUTANEOUS AUTO-INJECTOR
SUBCUTANEOUS | 11 refills | 28.00000 days | Status: CN
Start: 2019-08-19 — End: ?

## 2019-08-20 MED ORDER — NUCALA 100 MG/ML SUBCUTANEOUS AUTO-INJECTOR: 100 mg | mL | 11 refills | 28 days | Status: AC

## 2019-08-20 MED ORDER — NUCALA 100 MG/ML SUBCUTANEOUS AUTO-INJECTOR
SUBCUTANEOUS | 11 refills | 28.00000 days | Status: CP
Start: 2019-08-20 — End: 2019-08-20

## 2019-08-22 DIAGNOSIS — J4530 Mild persistent asthma, uncomplicated: Principal | ICD-10-CM

## 2019-08-26 ENCOUNTER — Encounter: Admit: 2019-08-26 | Discharge: 2019-08-27 | Payer: PRIVATE HEALTH INSURANCE

## 2019-08-26 DIAGNOSIS — J4550 Severe persistent asthma, uncomplicated: Principal | ICD-10-CM

## 2019-08-26 DIAGNOSIS — J45901 Unspecified asthma with (acute) exacerbation: Principal | ICD-10-CM

## 2019-08-26 DIAGNOSIS — R062 Wheezing: Principal | ICD-10-CM

## 2019-08-26 DIAGNOSIS — J4530 Mild persistent asthma, uncomplicated: Principal | ICD-10-CM

## 2019-08-26 MED ORDER — ALBUTEROL SULFATE 2.5 MG/3 ML (0.083 %) SOLUTION FOR NEBULIZATION
RESPIRATORY_TRACT | 0 refills | 5.00000 days | Status: CP | PRN
Start: 2019-08-26 — End: 2020-08-25

## 2019-08-26 MED ORDER — ALBUTEROL SULFATE HFA 90 MCG/ACTUATION AEROSOL INHALER
RESPIRATORY_TRACT | 0 refills | 0.00000 days | Status: CN | PRN
Start: 2019-08-26 — End: 2020-08-25

## 2019-08-26 MED ORDER — IBUPROFEN 600 MG TABLET
ORAL_TABLET | Freq: Four times a day (QID) | ORAL | 0 refills | 15.00000 days | Status: CP | PRN
Start: 2019-08-26 — End: ?

## 2019-08-26 MED ORDER — ADVAIR HFA 115 MCG-21 MCG/ACTUATION AEROSOL INHALER
0 refills | 0 days | Status: CN
Start: 2019-08-26 — End: ?

## 2019-08-26 MED ORDER — IPRATROPIUM 0.5 MG-ALBUTEROL 3 MG (2.5 MG BASE)/3 ML NEBULIZATION SOLN
Freq: Four times a day (QID) | RESPIRATORY_TRACT | 99 refills | 10.00000 days | Status: CP | PRN
Start: 2019-08-26 — End: 2020-08-25

## 2019-08-26 MED ORDER — FLUTICASONE PROPIONATE 230 MCG-SALMETEROL 21 MCG/ACTUATION HFA INHALER
Freq: Two times a day (BID) | RESPIRATORY_TRACT | 11 refills | 0 days | Status: CP
Start: 2019-08-26 — End: 2020-08-25

## 2019-08-26 MED ORDER — PREDNISONE 20 MG TABLET
ORAL_TABLET | Freq: Every day | ORAL | 0 refills | 7.00000 days | Status: CP
Start: 2019-08-26 — End: ?

## 2019-08-26 MED ORDER — MONTELUKAST 10 MG TABLET
ORAL_TABLET | Freq: Every day | ORAL | 1 refills | 90 days | Status: CP
Start: 2019-08-26 — End: ?

## 2019-09-02 MED ORDER — PREDNISONE 20 MG TABLET
ORAL_TABLET | Freq: Every day | ORAL | 0 refills | 10.00000 days | Status: CP
Start: 2019-09-02 — End: ?

## 2019-11-27 ENCOUNTER — Encounter: Admit: 2019-11-27 | Discharge: 2019-11-27 | Payer: PRIVATE HEALTH INSURANCE

## 2019-11-27 ENCOUNTER — Ambulatory Visit: Admit: 2019-11-27 | Discharge: 2019-11-27 | Payer: PRIVATE HEALTH INSURANCE

## 2019-11-27 DIAGNOSIS — Z7951 Long term (current) use of inhaled steroids: Principal | ICD-10-CM

## 2019-11-27 DIAGNOSIS — J4551 Severe persistent asthma with (acute) exacerbation: Principal | ICD-10-CM

## 2019-11-27 DIAGNOSIS — J4550 Severe persistent asthma, uncomplicated: Principal | ICD-10-CM

## 2019-11-27 DIAGNOSIS — D721 Eosinophilia, unspecified: Principal | ICD-10-CM

## 2019-11-27 MED ORDER — CODEINE 10 MG-GUAIFENESIN 100 MG/5 ML ORAL LIQUID
Freq: Three times a day (TID) | ORAL | 0 refills | 8 days | Status: CP | PRN
Start: 2019-11-27 — End: ?

## 2019-11-27 MED ORDER — PREDNISONE 20 MG TABLET
ORAL_TABLET | ORAL | 0 refills | 0.00000 days | Status: CP
Start: 2019-11-27 — End: 2019-12-30

## 2019-12-30 MED ORDER — PREDNISONE 20 MG TABLET
ORAL_TABLET | 0 refills | 0 days | Status: CP
Start: 2019-12-30 — End: 2020-02-24

## 2020-02-04 ENCOUNTER — Ambulatory Visit: Admit: 2020-02-04 | Discharge: 2020-02-05 | Payer: PRIVATE HEALTH INSURANCE

## 2020-02-04 DIAGNOSIS — K760 Fatty (change of) liver, not elsewhere classified: Principal | ICD-10-CM

## 2020-02-04 DIAGNOSIS — J4551 Severe persistent asthma with (acute) exacerbation: Principal | ICD-10-CM

## 2020-02-05 DIAGNOSIS — J4550 Severe persistent asthma, uncomplicated: Principal | ICD-10-CM

## 2020-02-05 DIAGNOSIS — J4531 Mild persistent asthma with (acute) exacerbation: Principal | ICD-10-CM

## 2020-02-13 DIAGNOSIS — J4530 Mild persistent asthma, uncomplicated: Principal | ICD-10-CM

## 2020-02-13 MED ORDER — ALBUTEROL SULFATE HFA 90 MCG/ACTUATION AEROSOL INHALER
RESPIRATORY_TRACT | 11 refills | 0 days | Status: CP | PRN
Start: 2020-02-13 — End: 2021-02-12

## 2020-02-19 DIAGNOSIS — J8281 Eosinophilic pneumonia (CMS-HCC): Principal | ICD-10-CM

## 2020-02-24 ENCOUNTER — Encounter: Admit: 2020-02-24 | Discharge: 2020-02-25 | Payer: PRIVATE HEALTH INSURANCE

## 2020-02-24 ENCOUNTER — Ambulatory Visit: Admit: 2020-02-24 | Discharge: 2020-02-25 | Payer: PRIVATE HEALTH INSURANCE

## 2020-02-24 DIAGNOSIS — J8281 Chronic eosinophilic pneumonia (CMS-HCC): Principal | ICD-10-CM

## 2020-02-24 DIAGNOSIS — K760 Fatty (change of) liver, not elsewhere classified: Principal | ICD-10-CM

## 2020-02-24 DIAGNOSIS — Z7951 Long term (current) use of inhaled steroids: Principal | ICD-10-CM

## 2020-02-24 DIAGNOSIS — J4530 Mild persistent asthma, uncomplicated: Principal | ICD-10-CM

## 2020-02-24 DIAGNOSIS — J4550 Severe persistent asthma, uncomplicated: Principal | ICD-10-CM

## 2020-02-24 DIAGNOSIS — D721 Eosinophilia, unspecified: Principal | ICD-10-CM

## 2020-02-24 MED ORDER — IBUPROFEN 600 MG TABLET
ORAL_TABLET | Freq: Four times a day (QID) | ORAL | 11 refills | 15 days | Status: CP | PRN
Start: 2020-02-24 — End: ?

## 2020-02-24 MED ORDER — SULFAMETHOXAZOLE 800 MG-TRIMETHOPRIM 160 MG TABLET
ORAL_TABLET | ORAL | 1 refills | 112.00000 days | Status: CP
Start: 2020-02-24 — End: ?

## 2020-02-24 MED ORDER — PREDNISONE 20 MG TABLET
ORAL_TABLET | Freq: Every day | ORAL | 3 refills | 56 days | Status: CP
Start: 2020-02-24 — End: ?

## 2020-02-26 ENCOUNTER — Ambulatory Visit: Admit: 2020-02-26 | Discharge: 2020-02-27 | Payer: PRIVATE HEALTH INSURANCE

## 2020-02-26 DIAGNOSIS — J8281 Chronic eosinophilic pneumonia (CMS-HCC): Principal | ICD-10-CM

## 2020-02-26 DIAGNOSIS — J4550 Severe persistent asthma, uncomplicated: Principal | ICD-10-CM

## 2020-02-26 DIAGNOSIS — J4530 Mild persistent asthma, uncomplicated: Principal | ICD-10-CM

## 2020-03-02 MED ORDER — CHOLECALCIFEROL (VITAMIN D3) 25 MCG (1,000 UNIT) TABLET
ORAL_TABLET | Freq: Every day | ORAL | 11 refills | 30 days | Status: CP
Start: 2020-03-02 — End: 2021-03-02

## 2020-07-08 ENCOUNTER — Encounter: Admit: 2020-07-08 | Discharge: 2020-07-09 | Payer: PRIVATE HEALTH INSURANCE

## 2020-07-08 DIAGNOSIS — J4550 Severe persistent asthma, uncomplicated: Principal | ICD-10-CM

## 2020-07-08 DIAGNOSIS — J8281 Chronic eosinophilic pneumonia (CMS-HCC): Principal | ICD-10-CM

## 2020-07-08 MED ORDER — PREDNISONE 5 MG TABLET
ORAL_TABLET | Freq: Every day | ORAL | 1 refills | 60 days | Status: CP
Start: 2020-07-08 — End: ?

## 2020-07-08 MED ORDER — FASENRA PEN 30 MG/ML SUBCUTANEOUS AUTO-INJECTOR
SUBCUTANEOUS | 2 refills | 0.00000 days | Status: CP
Start: 2020-07-08 — End: ?

## 2020-07-08 MED ORDER — PANTOPRAZOLE 40 MG TABLET,DELAYED RELEASE
ORAL_TABLET | Freq: Two times a day (BID) | ORAL | 3 refills | 90 days | Status: CP
Start: 2020-07-08 — End: 2021-07-08

## 2020-07-08 MED ORDER — DEXTROMETHORPHAN-GUAIFENESIN 10 MG-100 MG/5 ML ORAL SYRUP
ORAL | 11 refills | 1 days | Status: CP | PRN
Start: 2020-07-08 — End: ?

## 2020-07-08 MED ORDER — SULFAMETHOXAZOLE 800 MG-TRIMETHOPRIM 160 MG TABLET
ORAL_TABLET | ORAL | 1 refills | 112 days | Status: CP
Start: 2020-07-08 — End: ?

## 2020-07-08 MED ORDER — TRELEGY ELLIPTA 200 MCG-62.5 MCG-25 MCG POWDER FOR INHALATION
Freq: Every day | RESPIRATORY_TRACT | 11 refills | 0.00000 days | Status: CP
Start: 2020-07-08 — End: ?

## 2020-07-08 MED ORDER — BENRALIZUMAB 30 MG/ML SUBCUTANEOUS AUTO-INJECTOR
SUBCUTANEOUS | 2 refills | 0.00000 days | Status: CN
Start: 2020-07-08 — End: ?

## 2020-07-08 MED ORDER — PREDNISONE 20 MG TABLET
ORAL_TABLET | Freq: Every day | ORAL | 3 refills | 56 days | Status: CP
Start: 2020-07-08 — End: ?

## 2020-07-09 ENCOUNTER — Encounter: Admit: 2020-07-09 | Discharge: 2020-07-10 | Payer: PRIVATE HEALTH INSURANCE

## 2020-07-09 DIAGNOSIS — J4530 Mild persistent asthma, uncomplicated: Principal | ICD-10-CM

## 2020-07-09 DIAGNOSIS — J45901 Unspecified asthma with (acute) exacerbation: Principal | ICD-10-CM

## 2020-07-09 DIAGNOSIS — J4550 Severe persistent asthma, uncomplicated: Principal | ICD-10-CM

## 2020-07-09 DIAGNOSIS — J8281 Chronic eosinophilic pneumonia (CMS-HCC): Principal | ICD-10-CM

## 2020-07-09 MED ORDER — ALBUTEROL SULFATE 2.5 MG/3 ML (0.083 %) SOLUTION FOR NEBULIZATION
RESPIRATORY_TRACT | 11 refills | 5 days | Status: CP | PRN
Start: 2020-07-09 — End: 2021-07-09

## 2020-07-09 MED ORDER — MONTELUKAST 10 MG TABLET
ORAL_TABLET | Freq: Every day | ORAL | 1 refills | 90.00000 days
Start: 2020-07-09 — End: ?

## 2020-07-09 MED ORDER — FASENRA PEN 30 MG/ML SUBCUTANEOUS AUTO-INJECTOR
SUBCUTANEOUS | 11 refills | 0.00000 days | Status: CP
Start: 2020-07-09 — End: 2021-10-02

## 2020-07-10 MED ORDER — MONTELUKAST 10 MG TABLET
ORAL_TABLET | Freq: Every day | ORAL | 1 refills | 90 days | Status: CP
Start: 2020-07-10 — End: ?

## 2020-07-13 DIAGNOSIS — J4550 Severe persistent asthma, uncomplicated: Principal | ICD-10-CM

## 2020-07-15 DIAGNOSIS — J4530 Mild persistent asthma, uncomplicated: Principal | ICD-10-CM

## 2020-07-15 MED ORDER — ALBUTEROL SULFATE 2.5 MG/3 ML (0.083 %) SOLUTION FOR NEBULIZATION
RESPIRATORY_TRACT | 11 refills | 5 days | Status: CP | PRN
Start: 2020-07-15 — End: 2021-07-15

## 2020-07-15 MED ORDER — ALBUTEROL SULFATE HFA 90 MCG/ACTUATION AEROSOL INHALER
RESPIRATORY_TRACT | 11 refills | 0.00000 days | Status: CP | PRN
Start: 2020-07-15 — End: 2021-07-15

## 2020-07-22 MED ORDER — NUCALA 100 MG/ML SUBCUTANEOUS AUTO-INJECTOR
0 refills | 0 days
Start: 2020-07-22 — End: ?

## 2020-09-07 ENCOUNTER — Institutional Professional Consult (permissible substitution): Admit: 2020-09-07 | Discharge: 2020-09-08 | Payer: PRIVATE HEALTH INSURANCE

## 2020-10-14 ENCOUNTER — Ambulatory Visit: Admit: 2020-10-14 | Discharge: 2020-10-15 | Payer: PRIVATE HEALTH INSURANCE

## 2020-10-14 DIAGNOSIS — J4550 Severe persistent asthma, uncomplicated: Principal | ICD-10-CM

## 2020-10-14 DIAGNOSIS — J4530 Mild persistent asthma, uncomplicated: Principal | ICD-10-CM

## 2020-10-14 DIAGNOSIS — J8281 Chronic eosinophilic pneumonia (CMS-HCC): Principal | ICD-10-CM

## 2020-10-14 MED ORDER — PREDNISONE 5 MG TABLET
ORAL_TABLET | Freq: Every day | ORAL | 5 refills | 60 days | Status: CP
Start: 2020-10-14 — End: ?

## 2020-10-14 MED ORDER — BENZONATATE 100 MG CAPSULE
ORAL_CAPSULE | Freq: Three times a day (TID) | ORAL | 0 refills | 7 days | Status: CP | PRN
Start: 2020-10-14 — End: ?

## 2020-10-14 MED ORDER — ALBUTEROL SULFATE HFA 90 MCG/ACTUATION AEROSOL INHALER
RESPIRATORY_TRACT | 11 refills | 0 days | Status: CP | PRN
Start: 2020-10-14 — End: 2021-10-14

## 2020-11-19 MED ORDER — PREDNISONE 20 MG TABLET
ORAL_TABLET | Freq: Every day | ORAL | 0 refills | 5 days | Status: CP
Start: 2020-11-19 — End: ?

## 2020-11-23 MED ORDER — PREDNISONE 20 MG TABLET
ORAL_TABLET | ORAL | 0 refills | 7 days | Status: CP
Start: 2020-11-23 — End: 2020-11-30

## 2021-02-27 DIAGNOSIS — J45901 Unspecified asthma with (acute) exacerbation: Principal | ICD-10-CM

## 2021-02-27 DIAGNOSIS — J4530 Mild persistent asthma, uncomplicated: Principal | ICD-10-CM

## 2021-02-27 MED ORDER — MONTELUKAST 10 MG TABLET
ORAL_TABLET | Freq: Every day | ORAL | 1 refills | 90 days
Start: 2021-02-27 — End: ?

## 2021-02-27 MED ORDER — ALBUTEROL SULFATE HFA 90 MCG/ACTUATION AEROSOL INHALER
RESPIRATORY_TRACT | 11 refills | 0.00000 days | PRN
Start: 2021-02-27 — End: 2022-02-27

## 2021-02-27 MED ORDER — IBUPROFEN 600 MG TABLET
ORAL_TABLET | Freq: Four times a day (QID) | ORAL | 11 refills | 15 days | PRN
Start: 2021-02-27 — End: ?

## 2021-02-27 MED ORDER — BENZONATATE 100 MG CAPSULE
ORAL_CAPSULE | Freq: Three times a day (TID) | ORAL | 0 refills | 7 days | PRN
Start: 2021-02-27 — End: ?

## 2021-03-01 DIAGNOSIS — J4530 Mild persistent asthma, uncomplicated: Principal | ICD-10-CM

## 2021-03-01 MED ORDER — MONTELUKAST 10 MG TABLET
ORAL_TABLET | Freq: Every day | ORAL | 3 refills | 90.00000 days
Start: 2021-03-01 — End: ?

## 2021-03-01 MED ORDER — ALBUTEROL SULFATE HFA 90 MCG/ACTUATION AEROSOL INHALER
RESPIRATORY_TRACT | 11 refills | 0.00000 days | PRN
Start: 2021-03-01 — End: 2022-03-01

## 2021-03-02 MED ORDER — ALBUTEROL SULFATE HFA 90 MCG/ACTUATION AEROSOL INHALER
RESPIRATORY_TRACT | 11 refills | 0 days | Status: CP | PRN
Start: 2021-03-02 — End: 2022-03-02

## 2021-03-02 MED ORDER — MONTELUKAST 10 MG TABLET
ORAL_TABLET | Freq: Every day | ORAL | 3 refills | 90 days | Status: CP
Start: 2021-03-02 — End: ?

## 2021-04-25 MED ORDER — IBUPROFEN 600 MG TABLET
ORAL_TABLET | Freq: Four times a day (QID) | ORAL | 11 refills | 15 days | PRN
Start: 2021-04-25 — End: ?

## 2021-04-26 MED ORDER — IBUPROFEN 600 MG TABLET
ORAL_TABLET | Freq: Four times a day (QID) | ORAL | 11 refills | 15 days | Status: CP | PRN
Start: 2021-04-26 — End: ?

## 2021-07-13 MED ORDER — PREDNISONE 20 MG TABLET
ORAL_TABLET | ORAL | 0 refills | 10 days | Status: CP
Start: 2021-07-13 — End: 2021-07-23

## 2021-07-16 DIAGNOSIS — J45901 Unspecified asthma with (acute) exacerbation: Principal | ICD-10-CM

## 2021-07-16 MED ORDER — MONTELUKAST 10 MG TABLET
ORAL_TABLET | Freq: Every day | ORAL | 3 refills | 90 days | Status: CN
Start: 2021-07-16 — End: ?

## 2021-07-16 MED ORDER — EPINEPHRINE 0.3 MG/0.3 ML INJECTION, AUTO-INJECTOR
Freq: Every day | INTRAMUSCULAR | 2 refills | 0 days | PRN
Start: 2021-07-16 — End: ?

## 2021-07-19 MED ORDER — TRELEGY ELLIPTA 200 MCG-62.5 MCG-25 MCG POWDER FOR INHALATION
Freq: Every day | RESPIRATORY_TRACT | 11 refills | 0 days | Status: CP
Start: 2021-07-19 — End: ?

## 2021-07-19 MED ORDER — ALBUTEROL SULFATE 2.5 MG/3 ML (0.083 %) SOLUTION FOR NEBULIZATION
RESPIRATORY_TRACT | 11 refills | 5 days | Status: CP | PRN
Start: 2021-07-19 — End: 2022-07-19

## 2021-07-19 MED ORDER — EPINEPHRINE 0.3 MG/0.3 ML INJECTION, AUTO-INJECTOR
Freq: Every day | INTRAMUSCULAR | 2 refills | 0 days | Status: CP | PRN
Start: 2021-07-19 — End: ?

## 2021-07-19 MED ORDER — ALBUTEROL SULFATE HFA 90 MCG/ACTUATION AEROSOL INHALER
RESPIRATORY_TRACT | 11 refills | 0 days | Status: CP | PRN
Start: 2021-07-19 — End: 2022-07-19

## 2021-07-19 MED ORDER — PREDNISONE 20 MG TABLET
ORAL_TABLET | ORAL | 0 refills | 10 days | Status: CP
Start: 2021-07-19 — End: 2021-07-28

## 2021-07-19 MED ORDER — MONTELUKAST 10 MG TABLET
ORAL_TABLET | Freq: Every day | ORAL | 3 refills | 90 days | Status: CP
Start: 2021-07-19 — End: ?

## 2021-07-21 DIAGNOSIS — J4550 Severe persistent asthma, uncomplicated: Principal | ICD-10-CM

## 2021-07-21 DIAGNOSIS — J8281 Chronic eosinophilic pneumonia (CMS-HCC): Principal | ICD-10-CM

## 2021-07-22 MED ORDER — FASENRA PEN 30 MG/ML SUBCUTANEOUS AUTO-INJECTOR
SUBCUTANEOUS | 11 refills | 0 days | Status: CP
Start: 2021-07-22 — End: 2022-10-15

## 2021-07-28 ENCOUNTER — Ambulatory Visit: Admit: 2021-07-28 | Discharge: 2021-07-29 | Payer: PRIVATE HEALTH INSURANCE

## 2021-08-03 DIAGNOSIS — J8281 Chronic eosinophilic pneumonia (CMS-HCC): Principal | ICD-10-CM

## 2021-08-03 DIAGNOSIS — J45901 Unspecified asthma with (acute) exacerbation: Principal | ICD-10-CM

## 2021-08-03 DIAGNOSIS — J4550 Severe persistent asthma, uncomplicated: Principal | ICD-10-CM

## 2021-08-03 DIAGNOSIS — J4530 Mild persistent asthma, uncomplicated: Principal | ICD-10-CM

## 2021-08-03 MED ORDER — ALBUTEROL SULFATE HFA 90 MCG/ACTUATION AEROSOL INHALER
RESPIRATORY_TRACT | 11 refills | 0 days | Status: CP | PRN
Start: 2021-08-03 — End: 2022-08-03

## 2021-08-03 MED ORDER — AZITHROMYCIN 250 MG TABLET
ORAL_TABLET | Freq: Every day | ORAL | 3 refills | 90 days | Status: CP
Start: 2021-08-03 — End: 2022-08-03

## 2021-08-03 MED ORDER — TRELEGY ELLIPTA 200 MCG-62.5 MCG-25 MCG POWDER FOR INHALATION
Freq: Every day | RESPIRATORY_TRACT | 11 refills | 0 days | Status: CP
Start: 2021-08-03 — End: ?

## 2021-08-03 MED ORDER — PREDNISONE 5 MG TABLET
ORAL_TABLET | Freq: Every day | ORAL | 5 refills | 60 days | Status: CP
Start: 2021-08-03 — End: ?

## 2021-08-03 MED ORDER — MONTELUKAST 10 MG TABLET
ORAL_TABLET | Freq: Every day | ORAL | 3 refills | 90 days | Status: CP
Start: 2021-08-03 — End: ?

## 2021-08-11 ENCOUNTER — Ambulatory Visit: Admit: 2021-08-11 | Discharge: 2021-08-12 | Payer: PRIVATE HEALTH INSURANCE

## 2021-08-11 DIAGNOSIS — J4550 Severe persistent asthma, uncomplicated: Principal | ICD-10-CM

## 2021-08-11 DIAGNOSIS — J8281 Chronic eosinophilic pneumonia (CMS-HCC): Principal | ICD-10-CM

## 2021-08-11 DIAGNOSIS — K219 Gastro-esophageal reflux disease without esophagitis: Principal | ICD-10-CM

## 2021-08-11 MED ORDER — PANTOPRAZOLE 40 MG TABLET,DELAYED RELEASE
ORAL_TABLET | Freq: Two times a day (BID) | ORAL | 1 refills | 45 days | Status: CP
Start: 2021-08-11 — End: 2022-08-11

## 2021-08-12 DIAGNOSIS — J4550 Severe persistent asthma, uncomplicated: Principal | ICD-10-CM

## 2021-08-12 DIAGNOSIS — J8281 Chronic eosinophilic pneumonia (CMS-HCC): Principal | ICD-10-CM

## 2021-09-15 DIAGNOSIS — J4550 Severe persistent asthma, uncomplicated: Principal | ICD-10-CM

## 2021-09-15 DIAGNOSIS — J8281 Chronic eosinophilic pneumonia (CMS-HCC): Principal | ICD-10-CM

## 2021-09-15 MED ORDER — FASENRA PEN 30 MG/ML SUBCUTANEOUS AUTO-INJECTOR
SUBCUTANEOUS | 11 refills | 0 days | Status: CP
Start: 2021-09-15 — End: 2022-09-10

## 2021-09-26 DIAGNOSIS — J45901 Unspecified asthma with (acute) exacerbation: Principal | ICD-10-CM

## 2021-09-26 DIAGNOSIS — F419 Anxiety disorder, unspecified: Principal | ICD-10-CM

## 2021-09-26 DIAGNOSIS — J4550 Severe persistent asthma, uncomplicated: Principal | ICD-10-CM

## 2021-09-26 DIAGNOSIS — J8281 Chronic eosinophilic pneumonia (CMS-HCC): Principal | ICD-10-CM

## 2021-09-26 MED ORDER — AZITHROMYCIN 250 MG TABLET
ORAL_TABLET | Freq: Every day | ORAL | 3 refills | 90 days
Start: 2021-09-26 — End: 2022-09-26

## 2021-09-26 MED ORDER — DEXTROMETHORPHAN-GUAIFENESIN 10 MG-100 MG/5 ML ORAL SYRUP
ORAL | 11 refills | 1 days | PRN
Start: 2021-09-26 — End: ?

## 2021-09-26 MED ORDER — SERTRALINE 25 MG TABLET
ORAL_TABLET | Freq: Every day | ORAL | 0 refills | 30 days
Start: 2021-09-26 — End: 2021-10-26

## 2021-10-01 MED ORDER — SERTRALINE 25 MG TABLET
ORAL_TABLET | Freq: Every day | ORAL | 0 refills | 30 days
Start: 2021-10-01 — End: 2021-10-31

## 2021-10-05 MED ORDER — DEXTROMETHORPHAN-GUAIFENESIN 10 MG-100 MG/5 ML ORAL SYRUP
ORAL | 11 refills | 1 days | Status: CP | PRN
Start: 2021-10-05 — End: ?

## 2021-10-05 MED ORDER — AZITHROMYCIN 250 MG TABLET
ORAL_TABLET | Freq: Every day | ORAL | 3 refills | 90 days | Status: CP
Start: 2021-10-05 — End: 2022-10-05

## 2021-10-26 MED ORDER — PANTOPRAZOLE 40 MG TABLET,DELAYED RELEASE
ORAL_TABLET | Freq: Two times a day (BID) | ORAL | 2 refills | 60 days | Status: CP
Start: 2021-10-26 — End: ?

## 2022-01-14 ENCOUNTER — Ambulatory Visit
Admit: 2022-01-14 | Discharge: 2022-01-15 | Payer: MEDICARE | Attending: Student in an Organized Health Care Education/Training Program | Primary: Student in an Organized Health Care Education/Training Program

## 2022-01-14 DIAGNOSIS — J4550 Severe persistent asthma, uncomplicated: Principal | ICD-10-CM

## 2022-01-14 DIAGNOSIS — Z13228 Encounter for screening for other metabolic disorders: Principal | ICD-10-CM

## 2022-01-14 DIAGNOSIS — Z13220 Encounter for screening for lipoid disorders: Principal | ICD-10-CM

## 2022-01-14 DIAGNOSIS — R739 Hyperglycemia, unspecified: Principal | ICD-10-CM

## 2022-01-14 DIAGNOSIS — Z1239 Encounter for other screening for malignant neoplasm of breast: Principal | ICD-10-CM

## 2022-01-14 DIAGNOSIS — Z23 Encounter for immunization: Principal | ICD-10-CM

## 2022-01-14 DIAGNOSIS — D649 Anemia, unspecified: Principal | ICD-10-CM

## 2022-01-14 DIAGNOSIS — E559 Vitamin D deficiency, unspecified: Principal | ICD-10-CM

## 2022-01-14 DIAGNOSIS — K760 Fatty (change of) liver, not elsewhere classified: Principal | ICD-10-CM

## 2022-01-14 DIAGNOSIS — R5383 Other fatigue: Principal | ICD-10-CM

## 2022-01-14 DIAGNOSIS — Z1329 Encounter for screening for other suspected endocrine disorder: Principal | ICD-10-CM

## 2022-01-14 DIAGNOSIS — Z130 Encounter for screening for diseases of the blood and blood-forming organs and certain disorders involving the immune mechanism: Principal | ICD-10-CM

## 2022-01-19 ENCOUNTER — Ambulatory Visit: Admit: 2022-01-19 | Discharge: 2022-01-20 | Payer: MEDICARE

## 2022-01-20 DIAGNOSIS — E785 Hyperlipidemia, unspecified: Principal | ICD-10-CM

## 2022-01-20 MED ORDER — ROSUVASTATIN 5 MG TABLET
ORAL_TABLET | Freq: Every evening | ORAL | 0 refills | 180 days | Status: CP
Start: 2022-01-20 — End: 2022-07-19

## 2022-02-11 ENCOUNTER — Ambulatory Visit
Admit: 2022-02-11 | Discharge: 2022-02-12 | Payer: MEDICARE | Attending: Student in an Organized Health Care Education/Training Program | Primary: Student in an Organized Health Care Education/Training Program

## 2022-02-11 DIAGNOSIS — E785 Hyperlipidemia, unspecified: Principal | ICD-10-CM

## 2022-02-11 DIAGNOSIS — E781 Pure hyperglyceridemia: Principal | ICD-10-CM

## 2022-02-11 DIAGNOSIS — I10 Essential (primary) hypertension: Principal | ICD-10-CM

## 2022-02-11 DIAGNOSIS — J4550 Severe persistent asthma, uncomplicated: Principal | ICD-10-CM

## 2022-02-11 DIAGNOSIS — Z5986 Financial insecurity: Principal | ICD-10-CM

## 2022-02-11 DIAGNOSIS — K760 Fatty (change of) liver, not elsewhere classified: Principal | ICD-10-CM

## 2022-02-11 DIAGNOSIS — M549 Dorsalgia, unspecified: Principal | ICD-10-CM

## 2022-02-11 DIAGNOSIS — M25559 Pain in unspecified hip: Principal | ICD-10-CM

## 2022-02-11 DIAGNOSIS — Z7189 Other specified counseling: Principal | ICD-10-CM

## 2022-02-11 MED ORDER — ROSUVASTATIN 5 MG TABLET
ORAL_TABLET | Freq: Every evening | ORAL | 0 refills | 180 days | Status: CP
Start: 2022-02-11 — End: 2022-02-11

## 2022-02-18 ENCOUNTER — Ambulatory Visit: Admit: 2022-02-18 | Discharge: 2022-02-19 | Payer: MEDICARE

## 2022-02-18 DIAGNOSIS — Z7189 Other specified counseling: Principal | ICD-10-CM

## 2022-03-02 ENCOUNTER — Ambulatory Visit: Admit: 2022-03-02 | Discharge: 2022-03-03 | Payer: PRIVATE HEALTH INSURANCE

## 2022-03-02 DIAGNOSIS — J8281 Chronic eosinophilic pneumonia (CMS-HCC): Principal | ICD-10-CM

## 2022-03-02 DIAGNOSIS — J4550 Severe persistent asthma, uncomplicated: Principal | ICD-10-CM

## 2022-03-02 MED ORDER — DUPIXENT 300 MG/2 ML SUBCUTANEOUS PEN INJECTOR
SUBCUTANEOUS | 11 refills | 0 days | Status: CP
Start: 2022-03-02 — End: 2023-03-02

## 2022-03-02 MED ORDER — EPINEPHRINE 0.3 MG/0.3 ML INJECTION, AUTO-INJECTOR
Freq: Every day | INTRAMUSCULAR | 2 refills | 0 days | Status: CP | PRN
Start: 2022-03-02 — End: ?
  Filled 2022-03-29: qty 2, 1d supply, fill #0

## 2022-03-02 MED ORDER — PREDNISONE 1 MG TABLET
ORAL_TABLET | Freq: Every day | ORAL | 11 refills | 90 days | Status: CP
Start: 2022-03-02 — End: ?

## 2022-03-08 DIAGNOSIS — J4550 Severe persistent asthma, uncomplicated: Principal | ICD-10-CM

## 2022-03-08 MED ORDER — DUPIXENT 300 MG/2 ML SUBCUTANEOUS PEN INJECTOR
Freq: Once | SUBCUTANEOUS | 0 refills | 0.00000 days | Status: CP
Start: 2022-03-08 — End: 2022-03-08
  Filled 2022-03-28: qty 4, 14d supply, fill #0

## 2022-03-22 NOTE — Unmapped (Unsigned)
[facility]  Patient physically located at: [facility]    Patient ID: Dana Jacobs is a 42 y.o. female who presents for initial visit for affordability    Assessment/Plan:      Affordability  Patient reports the following medications are unaffordable: Dupixent   Prescription insurance status: BCBS    Assessment / Plan  Patient with high specialty copay. Goal to lower medication cost and improve adherence.   Assisted patient with signing up for Dupixent copay card today; provided information to [facility] pharmacy who confirmed this would adjust medication to $0 copay.  Pharmacy representative stated pharmacist would contact patient to continue setting up delivery given specialty status.   ID [id number]  BIN [id number]  PCN loyalty  Grp - [id number]  Educated patient on use of [facility] pharmacy; [phone]) patient programed number in her phone for ease of communication  Provided patient information on Trelegy coupon card to assist with affordability  Educated patient on BCBS formulary and albuterol inhaler options.    Follow-up  As needed for future cost concerns    Medication adherence and barriers to the treatment plan have been addressed. Opportunities to optimize healthy behaviors have been discussed. Patient / caregiver voiced understanding.      Future Appointments   Date Time Provider Department Center   05/11/2022  9:00 AM Threadgill, Coral Else, MD [facility]   05/13/2022  8:20 AM Mangel, Benison Pap, DO [facility]        >50% of this 45 minute visit spent in direct patient care, including counseling.    Note from today's visit sent via Epic to Mangel, Benison Pap, DO    Total length of visit: 45 minu  Supervising physician for CPP: Dr. Stephannie Peters, PharmD, CPP  Clinical Pharmacist, [facility] ([facility])  [facility] Clinic: [phone] - Fax: [phone] Subjective:     HPI: Dana Jacobs is a 42 y.o. female who presents for affordability of medications    Affordability: Patient states injection and sometime inhalers are not affordable to them at the time of this visit. New insurance recently, but minimal employees on plan so just started.      Objective:     Current Outpatient Medications   Medication Sig Dispense Refill    albuterol 2.5 mg /3 mL (0.083 %) nebulizer solution Inhale 3 mL (2.5 mg total) by nebulization every four (4) hours as needed for wheezing. 90 mL 11    albuterol HFA 90 mcg/actuation inhaler Inhale 2 puffs every four (4) hours as needed for wheezing. 8 g 11    azithromycin (ZITHROMAX) 250 MG tablet Take 1 tablet (250 mg total) by mouth daily. 90 tablet 3    dupilumab (DUPIXENT PEN) 300 mg/2 mL PnIj Inject the contents of 2 pens (600mg ) under the skin for loading dose 4 mL 0    dupilumab (DUPIXENT PEN) 300 mg/2 mL PnIj Inject the contents of 1 pen (300mg ) under the skin every 14 days as maintenance 4 mL 11    EPINEPHrine (EPIPEN 2-PAK) 0.3 mg/0.3 mL injection Inject 0.3 mL (0.3 mg total) into the muscle as needed for anaphylaxis. 2 each 2    fluticasone-umeclidin-vilanter (TRELEGY ELLIPTA) 200-62.5-25 mcg DsDv Inhale 1 puff daily. 60 each 11    ibuprofen (MOTRIN) 600 MG tablet Take 1 tablet (600 mg total) by mouth every six (6) hours as needed. 60 tablet 11  montelukast (SINGULAIR) 10 mg tablet Take 1 tablet (10 mg total) by mouth daily. 90 tablet 3    pantoprazole (PROTONIX) 40 MG tablet Take 1 tablet (40 mg total) by mouth Two (2) times a day. 120 tablet 2    predniSONE (DELTASONE) 1 MG tablet Take 1 tablet (1 mg total) by mouth daily. 90 tablet 11    predniSONE (DELTASONE) 5 MG tablet Take 2 tablets (10 mg total) by mouth daily. 120 tablet 5    rosuvastatin (CRESTOR) 5 MG tablet Take 2 tablets (10 mg total) by mouth nightly. 360 tablet 0     No current facility-administered medications for this visit. Unless otherwise noted, the patient has not had caffeine, exercised, or tobacco within the last two hours, they have taken their BP medication today, they have sat quietly for five minutes with feet flat on the floor prior to BP measurement.     ***Signs of target organ damage: {CAMP Reports Not Report:64142} {CAMP Target Organ Damage Options :73097}    There were no vitals filed for this visit.   Previous BP Readings  BP Readings from Last 6 Encounters:   03/02/22 122/82   02/11/22 140/110   01/14/22 144/90   08/11/21 131/82   10/14/20 127/80   07/09/20 146/93       ****  PCP: Mangel, Benison Pap, DO      Assessment/Plan:    No problem-specific Assessment & Plan notes found for this encounter.       Assessment: ***Copy and paste into problem list  Currently {camp asthma control:63977} per the NHLBI guidelines based on {asthma components:63978}. Goal of therapy to reduce symptoms and exacerbations.    Plan:  {CAMP Recommendations:44859}  Barriers identified (***) and addressed  Reviewed indication and technique for each inhaler, medication compliance, and early recognition and treatment of symptoms  Recommend nonpharmacologic measures, including: {asthma pt ed:64018}    Follow-up  {CAMP EC Follow Up Options:79181}  I have addressed all new and existing medications, adherence, and barriers. All patient questions/concerns addressed during visit today. Patient was provided with updated medication list and visit summary. Patient verbalizes understanding.    Future Appointments   Date Time Provider Department Center   05/11/2022  9:00 AM Threadgill, Coral Else, MD [facility]   05/13/2022  8:20 AM Mangel, Benison Pap, DO [facility]          >50% of this *** minute visit spent in direct patient care, including counseling.      The patient reports they are physically located in [place] and is currently: {patient location:[address]90}. I conducted a {phone audio video visit:101857} .       Notes from this visit will be sent via Epic to patient's provider: Mangel, Benison Pap, DO    ***  Subjective:     Chief Complaint: {Blank multiple:19196::shortness of breath,cough,daily sputum production,dyspnea on exertion,wheezing}    Symptoms reported: {camp asthma symptoms:63975}    Asthma Medication Regimen: {Maintenance:64326} and {Rescue:63909:::1}  - Utilizes rescue medications/SABA {SABA use:63932}     Affordability: Patient states inhalers listed above {Actions; are/are not:16769} affordable to them at the time of this visit. ***    Medication Adherence: {TB adherence:33536}  Inhaler Technique: {TB inhaler technique:33537}  {rinse/not rinse:64327} after inhaled corticosteroid use.    Non-pharmacologic therapies: {asthma pt ed:64018}    Exacerbations: {TB exacerbations:33549}  Hospitalizations: {TB Hospitalizations:33548}  Intubated: {TB Hospitalizations:33548}    Allergies: {AIRSMOG Allergies:28404}  Irritants: {AIRSMOG Irritants:28405}  Rhinitis: {AIRSMOG Rhinitis:28407}  Smoking: {  AIRSMOG Smoking:28900} {History smoking:10232}  Occupational Exposures: {PUL OCC EXPOSURES:28409}  Exposures: {AMB Pulm Pulmonary [phone]}          Objective:     LMP 06/06/2018 (Approximate)    Lab Results   Component Value Date    BUN 12 01/14/2022    Creatinine 0.80 01/14/2022    Sodium 138 01/14/2022    Potassium 3.8 01/14/2022     Current Outpatient Medications   Medication Sig Dispense Refill    albuterol 2.5 mg /3 mL (0.083 %) nebulizer solution Inhale 3 mL (2.5 mg total) by nebulization every four (4) hours as needed for wheezing. 90 mL 11    albuterol HFA 90 mcg/actuation inhaler Inhale 2 puffs every four (4) hours as needed for wheezing. 8 g 11    azithromycin (ZITHROMAX) 250 MG tablet Take 1 tablet (250 mg total) by mouth daily. 90 tablet 3    dupilumab (DUPIXENT PEN) 300 mg/2 mL PnIj Inject the contents of 2 pens (600mg ) under the skin for loading dose 4 mL 0    dupilumab (DUPIXENT PEN) 300 mg/2 mL PnIj Inject the contents of 1 pen (300mg ) under the skin every 14 days as maintenance 4 mL 11    EPINEPHrine (EPIPEN 2-PAK) 0.3 mg/0.3 mL injection Inject 0.3 mL (0.3 mg total) into the muscle as needed for anaphylaxis. 2 each 2    fluticasone-umeclidin-vilanter (TRELEGY ELLIPTA) 200-62.5-25 mcg DsDv Inhale 1 puff daily. 60 each 11    ibuprofen (MOTRIN) 600 MG tablet Take 1 tablet (600 mg total) by mouth every six (6) hours as needed. 60 tablet 11    montelukast (SINGULAIR) 10 mg tablet Take 1 tablet (10 mg total) by mouth daily. 90 tablet 3    pantoprazole (PROTONIX) 40 MG tablet Take 1 tablet (40 mg total) by mouth Two (2) times a day. 120 tablet 2    predniSONE (DELTASONE) 1 MG tablet Take 1 tablet (1 mg total) by mouth daily. 90 tablet 11    predniSONE (DELTASONE) 5 MG tablet Take 2 tablets (10 mg total) by mouth daily. 120 tablet 5    rosuvastatin (CRESTOR) 5 MG tablet Take 2 tablets (10 mg total) by mouth nightly. 360 tablet 0     No current facility-administered medications for this visit.        No results found for: PPRFEV1, PPRFVC, PREFEV1FVC  FVC PRE   Date Value Ref Range Status   02/24/2020 2.52 2.29 - 3.60 L Final   02/24/2020 2.52 2.29 - 3.60 L Final     FVC POST   Date Value Ref Range Status   07/28/2021 2.90 2.27 - 3.58 L Final   07/28/2021 2.90 2.27 - 3.58 L Final   07/28/2021 2.90 2.27 - 3.58 L Final     FEV1 PRE   Date Value Ref Range Status   02/24/2020 1.48 (L) 1.88 - 2.96 L Final   02/24/2020 1.48 (L) 1.88 - 2.96 L Final     FEV1 POST   Date Value Ref Range Status   07/28/2021 1.52 (L) 1.86 - 2.93 L Final   07/28/2021 1.52 (L) 1.86 - 2.93 L Final   07/28/2021 1.52 (L) 1.86 - 2.93 L Final

## 2022-03-23 ENCOUNTER — Institutional Professional Consult (permissible substitution): Admit: 2022-03-23 | Discharge: 2022-03-24 | Payer: PRIVATE HEALTH INSURANCE

## 2022-03-23 MED ORDER — EMPTY CONTAINER
2 refills | 0 days
Start: 2022-03-23 — End: ?

## 2022-03-23 NOTE — Unmapped (Addendum)
[facility] Specialty Medication Onboarding    Specialty Medication: DUPIXENT PEN 300 mg/2 mL Pnij (dupilumab)  Prior Authorization: Approved   Financial Assistance: No - patient must call for financial assistance per MAPs referral  Final Copay/Day Supply: $0 / 14 days (LD)          $0 / 28 days (MD)    Insurance Restrictions: None         The triage team has completed the benefits investigation and has determined that the patient is able to fill this medication at [facility]. Please contact the patient to complete the onboarding or follow up with the prescribing physician as needed.

## 2022-03-23 NOTE — Unmapped (Addendum)
Please sign up for the Trelegy copay card at: [email or web address].  Please provide this information to [facility].     Please provide Dupixent copay card information to the pharmacy.  Please call the [facility] to set up delivery.     Instructions for refilling medications from the [facility] [phone])  For your FIRST FILL, please call and speak with a staff member  For REFILLS, you should call at least 7 days before you run out of medicine  If your medicine needs to be stored in the refrigerator, please call the pharmacy to schedule a delivery date. You must be home to receive these medications.  Call the pharmacy if your address or phone number changes.       Please let us know if you run into any further issues.     Shanon Brow, PharmD, CPP  Clinical Pharmacist, [place] [facility] ([facility])  [facility] Clinic: [phone] - Fax: [phone]

## 2022-03-23 NOTE — Unmapped (Signed)
[facility]   Patient Onboarding/Medication Counseling    Dana Jacobs is a 42 y.o. female with severe persistent asthma who I am counseling today on initiation of therapy.  I am speaking to the patient.    Was a Nurse, learning disability used for this call? No    Verified patient's date of birth / HIPAA.    Specialty medication(s) to be sent: CF/Pulmonary/Asthma: Dupixent 300 mg/53mL      Non-specialty medications/supplies to be sent: epi-pen and sharps container      Medications not needed at this time: n/a       The patient declined counseling on medication administration and missed dose instructions because  they will receive counseling in clinic . The information in the declined sections below are for informational purposes only and was not discussed with patient.       Dupixent (dupilumab)    Medication & Administration     Dosage: Asthma, moderate to severe: Inject 600mg  under the skin as a loading dose followed by 300mg  every 14 days thereafter    Administration:     Dupixent Pen  1. Gather all supplies needed for injection on a clean, flat working surface: medication syringe removed from packaging, alcohol swab, sharps container, etc.  2. Look at the medication label - look for correct medication, correct dose, and check the expiration date  3. Look at the medication - the liquid in the pen should appear clear and colorless to pale yellow  4. Lay the pen on a flat surface and allow it to warm up to room temperature for at least 45 minutes  5. Select injection site - you can use the front of your thigh or your belly (but not the area 2 inches around your belly button); if someone else is giving you the injection you can also use your upper arm in the skin covering your triceps muscle  6. Prepare injection site - wash your hands and clean the skin at the injection site with an alcohol swab and let it air dry, do not touch the injection site again before the injection  7. Hold the middle of the body of the pen and gently pull the needle safety cap straight out. Be careful not to bend the needle. Do not remove until immediately prior to injection  8. Press the pen down onto the injection site at a 90 degree angle.   9. You will hear a click as the injection starts, and then a second click when the injection is ALMOST done. Keep holding the pen against the skin for 5 more seconds after the second click.   10. Check that the pen is empty by looking in the viewing window - the yellow indicator bar should be stopped, and should fill the window.   11. Remove the pen from the skin by lifting straight up.   12. Dispose of the used pen immediately in your sharps disposal container  13. If you see any blood at the injection site, press a cotton ball or gauze on the site and maintain pressure until the bleeding stops, do not rub the injection site    Adherence/Missed dose instructions:  If a dose is missed, administer within 7 days from the missed dose and then resume the original schedule. If the missed dose is not administered within 7 days, you can either wait until the next dose on the original schedule or take your dose now and resume every 14 days from the new injection  date. Do not use 2 doses at the same time or extra doses.      Goals of Therapy     -Reduce impairment - few night-time awakenings; maintenance of normal daily activities; optimization of lung function  -Minimal need (less than 2 days per week) of inhaled short acting beta agonists to relieve symptoms  -Prevention of recurrent exacerbations and need for emergency department/hospital care  -Prevention of reduced lung growth in children  -Maintenance of effective psychosocial functioning    Side Effects & Monitoring Parameters     Injection site reaction (redness, irritation, inflammation localized to the site of administration)  Signs of a common cold - minor sore throat, runny or stuffy nose, etc.  Recurrence of cold sores (herpes simplex)      The following side effects should be reported to the provider:  Signs of a hypersensitivity reaction - rash; hives; itching; red, swollen, blistered, or peeling skin; wheezing; tightness in the chest or throat; difficulty breathing, swallowing, or talking; swelling of the mouth, face, lips, tongue, or throat; etc.  Eye pain or irritation or any visual disturbances  Shortness of breath or worsening of breathing      Contraindications, Warnings, & Precautions     Have your bloodwork checked as you have been told by your prescriber   Birth control pills and other hormone-based birth control may not work as well to prevent pregnancy  Talk with your doctor if you are pregnant, planning to become pregnant, or breastfeeding  Discuss the possible need for holding your dose(s) of Dupixent?? when a planned procedure is scheduled with the prescriber as it may delay healing/recovery timeline       Drug/Food Interactions     Medication list reviewed in Epic. The patient was instructed to inform the care team before taking any new medications or supplements. No drug interactions identified.   Talk with you prescriber or pharmacist before receiving any live vaccinations while taking this medication and after you stop taking it    Storage, Handling Precautions, & Disposal     Store this medication in the refrigerator.  Do not freeze  If needed, you may store at room temperature for up to 14 days  Store in original packaging, protected from light  Do not shake  Dispose of used syringes in a sharps disposal container            Current Medications (including OTC/herbals), Comorbidities and Allergies     Current Outpatient Medications   Medication Sig Dispense Refill    albuterol 2.5 mg /3 mL (0.083 %) nebulizer solution Inhale 3 mL (2.5 mg total) by nebulization every four (4) hours as needed for wheezing. 90 mL 11    albuterol HFA 90 mcg/actuation inhaler Inhale 2 puffs every four (4) hours as needed for wheezing. 8 g 11    azithromycin (ZITHROMAX) 250 MG tablet Take 1 tablet (250 mg total) by mouth daily. 90 tablet 3    dupilumab (DUPIXENT PEN) 300 mg/2 mL PnIj Inject the contents of 1 pen (300mg ) under the skin every 14 days as maintenance 4 mL 11    EPINEPHrine (EPIPEN 2-PAK) 0.3 mg/0.3 mL injection Inject 0.3 mL (0.3 mg total) into the muscle as needed for anaphylaxis. 2 each 2    fluticasone-umeclidin-vilanter (TRELEGY ELLIPTA) 200-62.5-25 mcg DsDv Inhale 1 puff daily. 60 each 11    ibuprofen (MOTRIN) 600 MG tablet Take 1 tablet (600 mg total) by mouth every six (6) hours as needed. 60 tablet 11  montelukast (SINGULAIR) 10 mg tablet Take 1 tablet (10 mg total) by mouth daily. 90 tablet 3    pantoprazole (PROTONIX) 40 MG tablet Take 1 tablet (40 mg total) by mouth Two (2) times a day. 120 tablet 2    predniSONE (DELTASONE) 1 MG tablet Take 1 tablet (1 mg total) by mouth daily. 90 tablet 11    predniSONE (DELTASONE) 5 MG tablet Take 2 tablets (10 mg total) by mouth daily. 120 tablet 5    rosuvastatin (CRESTOR) 5 MG tablet Take 2 tablets (10 mg total) by mouth nightly. 360 tablet 0     No current facility-administered medications for this visit.       No Known Allergies    Patient Active Problem List   Diagnosis    Red blood cell antibody positive    S/P hysterectomy    Anxiety    Severe persistent asthma without complication    Chronic eosinophilic pneumonia (CMS-HCC)    Primary hypertension    Hyperlipidemia    Hepatic steatosis       Reviewed and up to date in Epic.    Appropriateness of Therapy     Acute infections noted within Epic:  No active infections  Patient reported infection: None    Is medication and dose appropriate based on diagnosis and infection status? Yes    Prescription has been clinically reviewed: Yes      Baseline Quality of Life Assessment      How many days over the past month did your severe persistent asthma  keep you from your normal activities? For example, brushing your teeth or getting up in the morning. Patient declined to answer    Financial Information     Medication Assistance provided: Prior Authorization and Copay Assistance    Anticipated copay of $0 reviewed with patient. Verified delivery address.    Delivery Information     Scheduled delivery date: 02/28/22    Expected start date: TBD - will need to receive first dose in clinic    Patient was notified of new phone menu: No    Medication will be delivered via [facility] to the temporary address in [place].  This shipment will not require a signature.      Explained the services we provide at [facility] and that each month we would call to set up refills.  Stressed importance of returning phone calls so that we could ensure they receive their medications in time each month.  Informed patient that we should be setting up refills 7-10 days prior to when they will run out of medication.  A pharmacist will reach out to perform a clinical assessment periodically.  Informed patient that a welcome packet, containing information about our pharmacy and other support services, a Notice of Privacy Practices, and a drug information handout will be sent.      The patient or caregiver noted above participated in the development of this care plan and knows that they can request review of or adjustments to the care plan at any time.      Patient or caregiver verbalized understanding of the above information as well as how to contact the pharmacy at [phone] option 4 with any questions/concerns.  The pharmacy is open Monday through Friday 8:30am-4:30pm.  A pharmacist is available 24/7 via pager to answer any clinical questions they may have.    Patient Specific Needs     Does the patient have any  physical, cognitive, or cultural barriers? No    Does the patient have adequate living arrangements? (i.e. the ability to store and take their medication appropriately) Yes    Did you identify any home environmental safety or security hazards? No    Patient prefers to have medications discussed with  Patient     Is the patient or caregiver able to read and understand education materials at a high school level or above? Yes    Patient's primary language is  English     Is the patient high risk? No    SOCIAL DETERMINANTS OF HEALTH     At the [facility], we have learned that life circumstances - like trouble affording food, housing, utilities, or transportation can affect the health of many of our patients.   That is why we wanted to ask: are you currently experiencing any life circumstances that are negatively impacting your health and/or quality of life? Patient declined to answer    Social Determinants of Health     Financial Resource Strain: Medium Risk (01/14/2022)    Overall Financial Resource Strain (CARDIA)     Difficulty of Paying Living Expenses: Somewhat hard   Internet Connectivity: No Internet connectivity concern identified (01/14/2022)    Internet Connectivity     Do you have access to internet services: Yes     How do you connect to the internet: Personal Device at home     Is your internet connection strong enough for you to watch video on your device without major problems?: Yes     Do you have enough data to get through the month?: Yes     Does at least one of the devices have a camera that you can use for video chat?: Yes   Food Insecurity: No Food Insecurity (01/14/2022)    Hunger Vital Sign     Worried About Running Out of Food in the Last Year: Never true     Ran Out of Food in the Last Year: Never true   Tobacco Use: Medium Risk (03/02/2022)    Patient History     Smoking Tobacco Use: Former     Smokeless Tobacco Use: Never     Passive Exposure: Not on file   Housing/Utilities: Low Risk  (01/14/2022)    Housing/Utilities     Within the past 12 months, have you ever stayed: outside, in a car, in a tent, in an overnight shelter, or temporarily in someone else's home (i.e. couch-surfing)?: No     Are you worried about losing your housing?: No     Within the past 12 months, have you been unable to get utilities (heat, electricity) when it was really needed?: No   Alcohol Use: Not At Risk (01/14/2022)    Alcohol Use     How often do you have a drink containing alcohol?: Never     How many drinks containing alcohol do you have on a typical day when you are drinking?: 3 - 4     How often do you have 5 or more drinks on one occasion?: Never   Transportation Needs: No Transportation Needs (01/14/2022)    PRAPARE - Transportation     Lack of Transportation (Medical): No     Lack of Transportation (Non-Medical): No   Substance Use: Low Risk  (01/14/2022)    Substance Use     Taken prescription drugs for non-medical reasons: Never     Taken illegal drugs: Never     Patient  indicated they have taken drugs in the past year for non-medical reasons: Yes, [positive answer(s)]: Not on file   Health Literacy: Low Risk  (04/24/2020)    Health Literacy     : Never   Physical Activity: Not on file   Interpersonal Safety: Not at risk (01/14/2022)    Interpersonal Safety     Unsafe Where You Currently Live: No     Physically Hurt by Anyone: No     Abused by Anyone: No   Stress: No Stress Concern Present (01/14/2022)    [employer] of Occupational Health - Occupational Stress Questionnaire     Feeling of Stress : Only a little   Intimate Partner Violence: Not At Risk (01/14/2022)    Humiliation, Afraid, Rape, and Kick questionnaire     Fear of Current or Ex-Partner: No     Emotionally Abused: No     Physically Abused: No     Sexually Abused: No   Depression: Not at risk (01/14/2022)    PHQ-2     PHQ-2 Score: 0   Social Connections: Not on file       Would you be willing to receive help with any of the needs that you have identified today? Not applicable       [name], PharmD  [facility] Specialty Pharmacist

## 2022-03-29 MED FILL — EMPTY CONTAINER: 120 days supply | Qty: 1 | Fill #0

## 2022-04-01 ENCOUNTER — Institutional Professional Consult (permissible substitution): Admit: 2022-04-01 | Discharge: 2022-04-02 | Payer: PRIVATE HEALTH INSURANCE

## 2022-04-01 NOTE — Unmapped (Signed)
Pt arrived to clinic for teaching of Dupixent. Pt was given education on the purpose of medication and was informed of potential side effects. Pt was given instruction of how to prep, inject, and dispose of the medication. Pt was able to verify understanding using the teach back method.     Pt was able to safely inject medication into their left and right lower abdomen. No complications noted during injection. Pt stayed one hour after injection for monitoring. No signs of moderate/severe reaction noted during stay.      All questions and concerns appropriately answered during visit. Pt instructed to call the clinic nurse line should they develop any questions or concerns regarding medication.      Nursing Assessment completed.   Drug allergies assessed.     Med supplied: [facility]Patient Supplied     Drug Administered: Dupixent 600mg  (loading dose)   Route: SubQ  Frequency: 14 days     Right lower abdomen: 300 mg  Lot #: [id number] / Exp 01/17/2024    Left lower abdomen: 300 mg  Lot #: [id number] / Exp 01/17/2024    Assessment:  Tolerated procedure well, no side effects. Patient has epi pen.   Patient aware to contact office if side effects occur.

## 2022-04-01 NOTE — Unmapped (Signed)
[facility] Clinical Assessment & Refill Coordination Note    Dana Jacobs, DOB: Jun 05, 1980  Phone: [phone] (home)     All above HIPAA information was verified with patient.     Was a Nurse, learning disability used for this call? No    Specialty Medication(s):   CF/Pulmonary/Asthma: Dupixent 300 mg/57mL     Current Outpatient Medications   Medication Sig Dispense Refill    albuterol 2.5 mg /3 mL (0.083 %) nebulizer solution Inhale 3 mL (2.5 mg total) by nebulization every four (4) hours as needed for wheezing. 90 mL 11    albuterol HFA 90 mcg/actuation inhaler Inhale 2 puffs every four (4) hours as needed for wheezing. 8 g 11    azithromycin (ZITHROMAX) 250 MG tablet Take 1 tablet (250 mg total) by mouth daily. 90 tablet 3    dupilumab (DUPIXENT PEN) 300 mg/2 mL PnIj Inject the contents of 1 pen (300mg ) under the skin every 14 days as maintenance 4 mL 11    empty container Misc Use as directed to dispose of Dupixent pens 1 each 2    EPINEPHrine (EPIPEN 2-PAK) 0.3 mg/0.3 mL injection Inject 0.3 mL (0.3 mg total) into the muscle as needed for anaphylaxis. 2 each 2    fluticasone-umeclidin-vilanter (TRELEGY ELLIPTA) 200-62.5-25 mcg DsDv Inhale 1 puff daily. 60 each 11    ibuprofen (MOTRIN) 600 MG tablet Take 1 tablet (600 mg total) by mouth every six (6) hours as needed. 60 tablet 11    montelukast (SINGULAIR) 10 mg tablet Take 1 tablet (10 mg total) by mouth daily. 90 tablet 3    pantoprazole (PROTONIX) 40 MG tablet Take 1 tablet (40 mg total) by mouth Two (2) times a day. 120 tablet 2    predniSONE (DELTASONE) 1 MG tablet Take 1 tablet (1 mg total) by mouth daily. 90 tablet 11    predniSONE (DELTASONE) 5 MG tablet Take 2 tablets (10 mg total) by mouth daily. 120 tablet 5    rosuvastatin (CRESTOR) 5 MG tablet Take 2 tablets (10 mg total) by mouth nightly. 360 tablet 0     No current facility-administered medications for this visit.        Changes to medications: Otila reports no changes at this time.    No Known Allergies    Changes to allergies: No    SPECIALTY MEDICATION ADHERENCE     Dupixent 300  mg/26mL : 0 days of medicine on hand     Medication Adherence    Patient reported X missed doses in the last month: 0  Specialty Medication: Dupixent 300 mg/60mL - inject 600mg  once (loading dose)  Patient is on additional specialty medications: No  Patient is on more than two specialty medications: No  Any gaps in refill history greater than 2 weeks in the last 3 months: no  Demonstrates understanding of importance of adherence: yes  Informant: patient          Specialty medication(s) dose(s) confirmed: Patient reports changes to the regimen as follows: Received loading dose today (3/15) in clinic. Will start maintenance dose 300mg  Q14d on 3/29.      Are there any concerns with adherence? No    Adherence counseling provided? Not needed    CLINICAL MANAGEMENT AND INTERVENTION      Clinical Benefit Assessment:    Do you feel the medicine is effective or helping your condition? No    Clinical Benefit counseling provided? Reasonable expectations discussed: We discussed that it can  take up to 4 months to determine clinical benefit. She verbalized understanding.    Adverse Effects Assessment:    Are you experiencing any side effects? No    Are you experiencing difficulty administering your medicine? Yes, patient reports some leakage when administering dose in clinic today. Medication administration counseling provided: We discussed to hold pen firming in place and waiting at least 5 secs before removing pen from skin after hearing 2nd click to prevent leakage. I informed her it can take 10-15 secs to receive full dose. She verbalized understanding.    Quality of Life Assessment:    Quality of Life    Rheumatology  Oncology  Dermatology  Cystic Fibrosis          How many days over the past month did your severe persistent asthma  keep you from your normal activities? For example, brushing your teeth or getting up in the morning. [name] states she tapering off of prednisone and is worried of experiencing worsening asthma symptoms once off of Dupixent.     Have you discussed this with your provider? Yes    Acute Infection Status:    Acute infections noted within Epic:  No active infections  Patient reported infection: None    Therapy Appropriateness:    Is therapy appropriate and patient progressing towards therapeutic goals? Yes, therapy is appropriate and should be continued    DISEASE/MEDICATION-SPECIFIC INFORMATION      For patients on injectable medications: Patient currently has 0 doses left.  Next injection is scheduled for 3/29.    Asthma and Allergy: Have you had an asthma exacerbation in the last 30 days? No  Have you needed to use your rescue inhaler more often than usual in the last 30 days? No  Have you needed to take steroids for your asthma in the last 30 days? Yes, currently taking prednisone 10mg  daily    PATIENT SPECIFIC NEEDS     Does the patient have any physical, cognitive, or cultural barriers? No    Is the patient high risk? No    Did the patient require a clinical intervention? No    Does the patient require physician intervention or other additional services (i.e., nutrition, smoking cessation, social work)? No    SOCIAL DETERMINANTS OF HEALTH     At the [facility], we have learned that life circumstances - like trouble affording food, housing, utilities, or transportation can affect the health of many of our patients.   That is why we wanted to ask: are you currently experiencing any life circumstances that are negatively impacting your health and/or quality of life? Patient declined to answer    Social Determinants of Health     Financial Resource Strain: Medium Risk (01/14/2022)    Overall Financial Resource Strain (CARDIA)     Difficulty of Paying Living Expenses: Somewhat hard   Internet Connectivity: No Internet connectivity concern identified (01/14/2022)    Internet Connectivity     Do you have access to internet services: Yes     How do you connect to the internet: Personal Device at home     Is your internet connection strong enough for you to watch video on your device without major problems?: Yes     Do you have enough data to get through the month?: Yes     Does at least one of the devices have a camera that you can use for video chat?: Yes   Food Insecurity: No Food Insecurity (01/14/2022)  Hunger Vital Sign     Worried About Running Out of Food in the Last Year: Never true     Ran Out of Food in the Last Year: Never true   Tobacco Use: Medium Risk (04/01/2022)    Patient History     Smoking Tobacco Use: Former     Smokeless Tobacco Use: Never     Passive Exposure: Not on file   Housing/Utilities: Low Risk  (01/14/2022)    Housing/Utilities     Within the past 12 months, have you ever stayed: outside, in a car, in a tent, in an overnight shelter, or temporarily in someone else's home (i.e. couch-surfing)?: No     Are you worried about losing your housing?: No     Within the past 12 months, have you been unable to get utilities (heat, electricity) when it was really needed?: No   Alcohol Use: Not At Risk (01/14/2022)    Alcohol Use     How often do you have a drink containing alcohol?: Never     How many drinks containing alcohol do you have on a typical day when you are drinking?: 3 - 4     How often do you have 5 or more drinks on one occasion?: Never   Transportation Needs: No Transportation Needs (01/14/2022)    PRAPARE - Transportation     Lack of Transportation (Medical): No     Lack of Transportation (Non-Medical): No   Substance Use: Low Risk  (01/14/2022)    Substance Use     Taken prescription drugs for non-medical reasons: Never     Taken illegal drugs: Never     Patient indicated they have taken drugs in the past year for non-medical reasons: Yes, [positive answer(s)]: Not on file   Health Literacy: Low Risk  (04/24/2020)    Health Literacy     : Never Physical Activity: Not on file   Interpersonal Safety: Not at risk (01/14/2022)    Interpersonal Safety     Unsafe Where You Currently Live: No     Physically Hurt by Anyone: No     Abused by Anyone: No   Stress: No Stress Concern Present (01/14/2022)    [employer] of Occupational Health - Occupational Stress Questionnaire     Feeling of Stress : Only a little   Intimate Partner Violence: Not At Risk (01/14/2022)    Humiliation, Afraid, Rape, and Kick questionnaire     Fear of Current or Ex-Partner: No     Emotionally Abused: No     Physically Abused: No     Sexually Abused: No   Depression: Not at risk (01/14/2022)    PHQ-2     PHQ-2 Score: 0   Social Connections: Not on file       Would you be willing to receive help with any of the needs that you have identified today? Not applicable       SHIPPING     Specialty Medication(s) to be Shipped:   CF/Pulmonary/Asthma: Dupixent 300 mg/76mL    Other medication(s) to be shipped: No additional medications requested for fill at this time     Changes to insurance: No    Patient was informed of new phone menu: No    Delivery Scheduled: Yes, Expected medication delivery date: 04/12/22.     Medication will be delivered via Same Day Courier to the confirmed prescription address in [facility].    The patient will receive a drug information handout for each  medication shipped and additional FDA Medication Guides as required.  Verified that patient has previously received a Conservation officer, historic buildings and a Surveyor, mining.    The patient or caregiver noted above participated in the development of this care plan and knows that they can request review of or adjustments to the care plan at any time.      All of the patient's questions and concerns have been addressed.    [name], PharmD   [facility] Specialty Pharmacist

## 2022-04-12 MED FILL — DUPIXENT 300 MG/2 ML SUBCUTANEOUS PEN INJECTOR: SUBCUTANEOUS | 28 days supply | Qty: 4 | Fill #0

## 2022-04-13 DIAGNOSIS — K219 Gastro-esophageal reflux disease without esophagitis: Principal | ICD-10-CM

## 2022-05-03 NOTE — Unmapped (Signed)
[facility] Refill Coordination Note    Specialty Medication(s) to be Shipped:   Inflammatory Disorders: Dupixent    Other medication(s) to be shipped: No additional medications requested for fill at this time     Dana Jacobs, DOB: [birth year]  Phone: [phone] (home)       All above HIPAA information was verified with patient.     Was a Nurse, learning disability used for this call? No    Completed refill call assessment today to schedule patient's medication shipment from the [facility] [phone]).  All relevant notes have been reviewed.     Specialty medication(s) and dose(s) confirmed: Regimen is correct and unchanged.   Changes to medications: Shahidah reports no changes at this time.  Changes to insurance: No  New side effects reported not previously addressed with a pharmacist or physician: Yes - Patient reports some back pain but unsure if related to Dupixent injections. Patient would like to speak to the pharmacist today. Their provider is not aware.  Questions for the pharmacist: Yes: We discussed muscle and joint pain is a reported side effect that occurs for 2-3% of those taking Dupixent. I encouraged her to follow-up with PCP if back pain worsens without relief from OTC analgesics.     Confirmed patient received a Conservation officer, historic buildings and a Surveyor, mining with first shipment. The patient will receive a drug information handout for each medication shipped and additional FDA Medication Guides as required.       DISEASE/MEDICATION-SPECIFIC INFORMATION        For patients on injectable medications: Patient currently has 0 doses left.  Next injection is scheduled for 4/26.    SPECIALTY MEDICATION ADHERENCE     Medication Adherence    Patient reported X missed doses in the last month: 0  Specialty Medication: Dupixent 300 mg/50mL Q14d  Patient is on additional specialty medications: No  Patient is on more than two specialty medications: No  Any gaps in refill history greater than 2 weeks in the last 3 months: no  Demonstrates understanding of importance of adherence: no  Informant: patient          Were doses missed due to medication being on hold? No    Dupixent 300  mg/32mL : 0 days of medicine on hand     REFERRAL TO PHARMACIST     Referral to the pharmacist: Not needed      [facility]     Shipping address confirmed in Epic.       Delivery Scheduled: Yes, Expected medication delivery date: 05/10/22.     Medication will be delivered via Same Day Courier to the prescription address in Epic WAM.    Dana Jacobs, PharmD   [facility] Specialty Pharmacist

## 2022-05-10 MED FILL — DUPIXENT 300 MG/2 ML SUBCUTANEOUS PEN INJECTOR: SUBCUTANEOUS | 28 days supply | Qty: 4 | Fill #1

## 2022-05-13 MED ORDER — AZITHROMYCIN 250 MG TABLET
ORAL_TABLET | Freq: Every day | ORAL | 3 refills | 90 days | Status: CP
Start: 2022-05-13 — End: 2023-05-13

## 2022-05-13 MED ORDER — TRELEGY ELLIPTA 200 MCG-62.5 MCG-25 MCG POWDER FOR INHALATION
Freq: Every day | RESPIRATORY_TRACT | 11 refills | 0 days | Status: CP
Start: 2022-05-13 — End: ?

## 2022-05-13 MED ORDER — ALBUTEROL SULFATE HFA 90 MCG/ACTUATION AEROSOL INHALER
RESPIRATORY_TRACT | 11 refills | 0 days | Status: CP | PRN
Start: 2022-05-13 — End: 2023-05-13

## 2022-05-13 MED ORDER — MONTELUKAST 10 MG TABLET
ORAL_TABLET | Freq: Every day | ORAL | 3 refills | 90 days | Status: CP
Start: 2022-05-13 — End: ?

## 2022-05-13 MED ORDER — PANTOPRAZOLE 40 MG TABLET,DELAYED RELEASE
ORAL_TABLET | Freq: Two times a day (BID) | ORAL | 2 refills | 60 days | Status: CP
Start: 2022-05-13 — End: ?

## 2022-05-13 MED ORDER — ALBUTEROL SULFATE 2.5 MG/3 ML (0.083 %) SOLUTION FOR NEBULIZATION
RESPIRATORY_TRACT | 11 refills | 5 days | Status: CP | PRN
Start: 2022-05-13 — End: 2023-05-13

## 2022-05-14 NOTE — Unmapped (Signed)
Refilled medications

## 2022-06-15 NOTE — Unmapped (Signed)
Regarding: TX:  Call Hub   -wheezing  ----- Message from Sherrie Mustache sent at 06/15/2022 12:59 PM EDT -----  Wheezing, scheduled first available appt at PCPs office tomorrow @ 3pm

## 2022-06-15 NOTE — Unmapped (Signed)
Upcoming Appt:  Future Appointments   Date Time Provider Department Center   06/16/2022  3:00 PM Tessie Eke, MD [facility]       Recent:   What is the date of your last related visit?  Not reviewed with pt  Related acute medications Rx'd:  albuterol MDI 1 puff every other hr; not using nebulizer.  Pt calling from work.  Home treatment tried:  not reviewed       Relevant:   Allergies: Patient has no known allergies.  Medications: monthly dupixant , albuterol inhaler, trelegy; has nebulizer - hasn't used this  Health History: asthma,   Weight: not reviewed      [place] [facility] patients only:  What was the date of your last cancer treatment (mm/dd/yy)?: n/a  Was the treatment oral or infusion?: n/a  Are you currently on TVEC (yes/no)?: n/a  Reason for Disposition   [1] MODERATE asthma attack (e.g., SOB at rest, speaks in phrases, audible wheezes) AND [2] not resolved after 2 or 3 inhaler or nebulizer treatments given 20 minutes apart    Answer Assessment - Initial Assessment Questions  1. RESPIRATORY STATUS: Describe your breathing? (e.g., wheezing, shortness of breath, unable to speak, severe coughing)       Wheezing with every breath, coughing calms the wheezing down; SOB w/ activity. No wheezing heard on the phone but pt sounds uncomfortable.  2. ONSET: When did this asthma attack begin?       yesterday  3. TRIGGER: What do you think triggered this attack? (e.g., URI, exposure to pollen or other allergen, tobacco smoke)       Grandson has a URI  4. PEAK EXPIRATORY FLOW RATE (PEFR): Do you use a peak flow meter? If Yes, ask: What's the current peak flow? What's your personal best peak flow?       N/a  5. SEVERITY: How bad is this attack?     - MILD: No SOB at rest, mild SOB with walking, speaks normally in sentences, can lie down, no retractions, pulse < 100. (GREEN Zone: PEFR 80-100%)    - MODERATE: SOB at rest, SOB with minimal exertion and prefers to sit, cannot lie down flat, speaks in phrases, mild retractions, audible wheezing, pulse 100-120. (YELLOW Zone: PEFR 50-79%)     - SEVERE: Struggling for each breath, speaks in single words, struggling to breathe, sitting hunched forward, retractions, usually loud wheezing, sometimes minimal wheezing because of decreased air movement, pulse > 120. (RED Zone: PEFR < 50%).       moderate  6. ASTHMA MEDICINES:  What treatments have you tried?     - INHALED QUICK RELIEF (RESCUE): What is your inhaled quick-relief medicine? (e.g., albuterol, salbutamol) Do you use an inhaler or a nebulizer? How frequently have you been using this medicine?    - CONTROLLER (LONG-TERM-CONTROL): Do you take an inhaled steroid? (e.g., Asmanex, Flovent, Pulmicort, Qvar)      Using albuterol 1 puff every other hr; trelegy, dupixant  7. INHALED QUICK-RELIEF TREATMENTS FOR THIS ATTACK: What treatments have you given yourself so far? and How many and how often? If using an inhaler, ask, How many puffs? Note: Routine treatments are 2 puffs every 4 hours as needed. Rescue treatments are 4 puffs repeated every 20 minutes, up to three times as needed.       1 puff of albuterol every other hr; last used an hr ago.  8. OTHER SYMPTOMS: Do you have any other symptoms? (e.g., chest  pain, coughing up yellow sputum, fever, runny nose)      Denies chest pain, runnny nose. Coughing yellow sputum, feels feverish  9. O2 SATURATION MONITOR:  Do you use an oxygen saturation monitor (pulse oximeter) at home? If Yes, What is your reading (oxygen level) today? What is your usual oxygen saturation reading? (e.g., 95%)      N/a  10. PREGNANCY: Is there any chance you are pregnant? When was your last menstrual period?        No; s/p hyst    Protocols used: Asthma Attack-A-AH

## 2022-06-16 ENCOUNTER — Ambulatory Visit
Admit: 2022-06-16 | Discharge: 2022-06-17 | Payer: PRIVATE HEALTH INSURANCE | Attending: Student in an Organized Health Care Education/Training Program | Primary: Student in an Organized Health Care Education/Training Program

## 2022-06-16 DIAGNOSIS — J029 Acute pharyngitis, unspecified: Principal | ICD-10-CM

## 2022-06-16 DIAGNOSIS — R051 Acute cough: Principal | ICD-10-CM

## 2022-06-16 DIAGNOSIS — J45901 Unspecified asthma with (acute) exacerbation: Principal | ICD-10-CM

## 2022-06-16 MED ORDER — LIDOCAINE HCL 2 % MUCOSAL SOLUTION
0 refills | 0 days | Status: CP
Start: 2022-06-16 — End: ?

## 2022-06-16 MED ORDER — PREDNISONE 20 MG TABLET
ORAL_TABLET | Freq: Every day | ORAL | 0 refills | 5 days | Status: CP
Start: 2022-06-16 — End: 2022-06-21

## 2022-06-16 MED ORDER — BENZONATATE 200 MG CAPSULE
ORAL_CAPSULE | Freq: Three times a day (TID) | ORAL | 0 refills | 7 days | Status: CP | PRN
Start: 2022-06-16 — End: 2022-06-23

## 2022-06-16 NOTE — Unmapped (Signed)
[facility] Refill Coordination Note    Specialty Medication(s) to be Shipped:   CF/Pulmonary/Asthma: Dupixent 300mg /61ml    Other medication(s) to be shipped: No additional medications requested for fill at this time     Dana Jacobs, DOB: 1980/02/06  Phone: [phone] (home)       All above HIPAA information was verified with patient.     Was a Nurse, learning disability used for this call? No    Completed refill call assessment today to schedule patient's medication shipment from the [facility] [phone]).  All relevant notes have been reviewed.     Specialty medication(s) and dose(s) confirmed: Regimen is correct and unchanged.   Changes to medications: Sujei reports no changes at this time.  Changes to insurance: No  New side effects reported not previously addressed with a pharmacist or physician: None reported  Questions for the pharmacist: No    Confirmed patient received a Conservation officer, historic buildings and a Surveyor, mining with first shipment. The patient will receive a drug information handout for each medication shipped and additional FDA Medication Guides as required.       DISEASE/MEDICATION-SPECIFIC INFORMATION        For patients on injectable medications: Patient currently has 0 doses left.  Next injection is scheduled for 06/24/22.    SPECIALTY MEDICATION ADHERENCE     Medication Adherence    Patient reported X missed doses in the last month: 0  Specialty Medication: DUPIXENT PEN 300 mg/2 mL Pnij (dupilumab)  Patient is on additional specialty medications: No  Patient is on more than two specialty medications: No  Any gaps in refill history greater than 2 weeks in the last 3 months: no  Demonstrates understanding of importance of adherence: yes  Informant: patient  Reliability of informant: reliable  Provider-estimated medication adherence level: good  Patient is at risk for Non-Adherence: No  Reasons for non-adherence: no problems identified              Were doses missed due to medication being on hold? No    DUPIXENT PEN 300 mg/2 mL Pnij (dupilumab)   : 0 days of medicine on hand       REFERRAL TO PHARMACIST     Referral to the pharmacist: Not needed      [facility]     Shipping address confirmed in Epic.       Delivery Scheduled: Yes, Expected medication delivery date: 06/22/22.     Medication will be delivered via Same Day Courier to the prescription address in Epic WAM.    [facility] Specialty Technician

## 2022-06-16 NOTE — Unmapped (Signed)
Dana Jacobs  October 31, 1980    Assessment & Plan:  Acute cough  -     RAPID INFLUENZA/RSV/COVID PCR  -     XR Chest 2 views; Future  -     benzonatate; Take 1 capsule (200 mg total) by mouth Three (3) times a day as needed for cough for up to 7 days.    Sore throat  -     RAPID INFLUENZA/RSV/COVID PCR  -     POCT Rapid Group A Strep; Future  -     XR Chest 2 views; Future  -     lidocaine HCL; 5 mL swished in the mouth and spit out no more frequently than every 3 hours    Severe asthma with exacerbation, unspecified whether persistent  -     predniSONE; Take 2 tablets (40 mg total) by mouth daily for 5 days.      42 yo female presents today for URI symptoms   -She has had symptoms (in particular sore throat and cough) since 06/12/2022 but that acutely worsened on 06/15/2022.   -She has been using her albuterol inhaler more frequently but not nebulizer at home  -We discussed that her symptoms at this time are not consistent with Bacterial Pneumonia/CAP as she has remained afebrile, without increased sputum production and without worsening dyspnea. Her symptoms appear more consistent with a Viral URI causing mild asthma exacerbation   -POCT Strep test was discussed and found to be NEGATIVE.     PLAN  -Will obtain CXR  -Will provide Prednisone burst (40mg  for 5 days)   -Advised to use home Albuterol Nebulizer   -Continue current Azithromycin 250mg . Deferred adding additional abx at this time  -For sore throat, will provide viscous lidocaine  -For couth, will provide Tessalon Pearls   -Encouraged to stay hydrated as well   -Continue Singulair, Trellegy    Emergency room precautions were discussed in depth with patient who verbalized understanding and agreement   Return if symptoms worsen or fail to improve.    BP elevated today but likely secondary to acute illness. Will address further next visit     Subjective:  Subjective    HPI: Dana Jacobs is a 42 y.o. female here for URI symptoms     She had her grandson over the weekend. He developed a rash and cough and was later diagnosed with URI.   Her symptoms started 06/11/2024 but got worse on 06/15/2022 (day prior to our visit)   She reports having a sore throat with an intermittent cough.   Only experiencing dyspnea after coughing spells.   She has not tried other treatments for this.   She has been using her inhalers more frequently but has not used her nebulizer    She did have diarrhea yesterday (06/15/2022)   She has remained afebrile         ROS:  Review of systems negative unless otherwise noted as per HPI.    Objective:  Objective    Vitals:    06/16/22 1402   BP: 168/120   Pulse: 78   Temp: 37.7 ??C (99.8 ??F)   SpO2: 98%     Body mass index is 26.25 kg/m??.    Physical Exam:  Physical Exam   Const: she is not toxic appearing or in distress. Mildly ill appearing   Head: normocephalic, atraumatic   Ears: TM without erythema, retractions, perforation  Eyes: EOMI  Nose: no significant nasal congestion noted  Throat/Neck: Difficulty assessing full posterior oropharynx due to pain but no obvious tonsillar abscess noted. No Cervical or supraclavicular adenopathy   Cardiac: RRR  Lungs: non productive cough on today's exam. There is significant expiratory wheeze (right>left). She does NOT have increased work of breathing, conversational dyspnea or tripoding  Psych: very pleasant  Neuro: AOx3

## 2022-06-21 NOTE — Unmapped (Unsigned)
Pulmonary Clinic - Follow-up Visit      HISTORY:     Active Pulmonary Problems & Brief History:  Dana Jacobs is a 42 y.o. woman with moderate persistent allergic asthma here for follow up of asthma .     Interval History:  - 2019 - diagnosed with asthma, very steroid responsive, had hypereosinophilia, improved with inhalers and nucala  - 09/02/19 - first visit with me - was not on nucala due to insurance lapse. Was having flare, gave steroid burst, 2-3 weeks, with significant improvement in symptoms but rebound upon cessation.   - 11/27/19 - last visit with me - was on advair, montelukast, and mepolizumab (started end of September 2021). Was still not feeling particularly well. Given lack of improvement with 2 months of biologic and inhalers we decided to move forward with cross sectional imaging which revealed peripheral ground-glass opacities. Plan was to proceed with bronchoscopy, but would be too costly for patient so visit set up to discuss options.  - 02/24/20 - breathing not improved. Still with a lot of dyspnea, comes intermittently, even when doing nothing, waking up multiple times per night with shortness of breath. Treated with prolonged steroid taper and TMP-SMX PJP PPX. Thought at that time she had some component of chronic eosinophilic pneumonia based on imaging changes. Planned to treat empirically and see how she was doing in several months.   - 07/09/20 - patient continuing prednisone 30mg  daily since our last visit. Using nucala, advair, montelukast, bactrim MWF. Still has 5 times a day at least when she's still short of breath, wakes up a couple times per night feeling short of breath. Compared to February she is improved, but has been on continuous prednisone. Doesn't feel like nucala doing the trick. No fevers, chills, night sweats, lost 8 pounds despite prednisone. Is doing more exercise, walking, body weight exercises, but is limited by shortness of breath. Advair changed to trelegy. Mepolizumab changed to benralizumab.   - 10/12/20 - feeling a bit better but still using rescue inhaler probably 8 times per day on average. Symptoms include chest tightness, wheezing, coughing. Is having a lot of nasal mucus production but not with coughing. Is using trelegy without difficulty, taking montelukast. Has received 2 doses of fasenra once at time of last visit, 07/09/20, and the second was 09/07/20. Didn't notice any difference with nucala but with fasenra noticed worsened low back pain, increased functional status. Able to exert herself more. Currently taking 5mg  daily of prednisone. Feels like she noticed a difference in symptoms switching from 10mg  daily to 5mg  daily. With dropping dose had increased chest tightness. No acid reflux, still taking pantoprazole twice daily.   - 08/11/21 - is having chest pressure prior to asthma exacerbation when she feels like she can't take a deep breath.  No issues with azithromycin. Trelegy still daily. Singulair daily. Prednisone at 15mg  daily currently. IS walking for exercise - goal of 6000 steps per day. Able to go a mile and half at the track, hasn't timed it. Does notice increased reflux symptoms with high acid foods. Not taking any acid reflux medication  - 03/02/22 - breathing comes and goes. Is doing some walking, probably 45 minutes most days, but is having to stop and catch her breath a lot during the exercise. Is still waking up at night, probably 4 times per week. Using trelegy, montelukast, albuterol (multiple doses per day), prednisone 15mg  daily, azithromycin 250mg  daily. Weight stable between 140-150 a bit heavier than her historical  weight. When she tried dropping her prednisone from 15mg  daily to 10mg  daily she had to go to urgent care due to sharp pain in the chest. Uncertain where we stand with co-pay assistance on a biologic medication. No smoking.  One exacerbation requiring prednisone on 01/22/22, 06/2021, 07/2021    06/22/22 - follow up - prednisone 06/16/2022    Past Medical History: The medical and surgical history were personally reviewed and updated in the patient's electronic medical record. Pertinent positives are documented above.    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 No mold in the house that she is aware of, trailer, no mold underneath that she is aware of. No pets.     Other History: The social history and family history were personally reviewed and updated in the patient's electronic medical record. Pertinent positives are documented above.  Home Medications: Medications were reviewed and updated in the patient's electronic medical record. Pertinent positives are documented above.   Allergies: Allergies were reviewed and updated in the patient's electronic medical record. Pertinent positives are documented above.  Review of Systems: A comprehensive review of systems was completed and negative except as noted in HPI.    PHYSICAL EXAM:     There were no vitals filed for this visit.    General: pleasant individual appearing stated age, alert and oriented, no acute distress  HEENT: trachea midline, supple  CV: RRR, no m/r/g  Lungs: CTAB, good air movement  Abd: Soft, NT/ND, no rebound or guarding  Ext: Warm, well perfused, no peripheral edema  Skin: No rashes, skin breakdown, or wounds  Neuro: CN II-XII intact to conversation, alert and oriented.     LABORATORY and RADIOLOGY DATA:     PFT  07/28/21       Moderate obstruction, normal inspiratory flow volume loop, no restriction, significant air trapping, high airway pressures, normal DLCO. Consistent with known asthma.     Pertinent Laboratory Data:   02/26/20  CBC w/ diff - abs eos 0.0  ANCA - negative     11/27/19  Negative - ANCA, ABPA      01/24/19  Negative - HP panel  CBC with diff - 1400 eosinophils (10/25/18 had 800)     10/25/19   IgE - 85     Pertinent Imaging Data:  02/04/20 CT Chest  1. There is no significant imaging evidence for fibrotic interstitial lung disease. Scattered groundglass opacities, predominantly peripheral and greatest in the left lower lobe may represent infection (including COVID-19 pneumonia), or possibly more chronic inflammatory process such as eosinophilic lung disease.     2.  Geographic steatosis of the liver with a hyperattenuating 1.3 cm focus in segment 4A. The latter may simply reflect a focal area of fatty sparing In absence of known malignancy. If clinically relevant, a hepatic ultrasound or MRI could further characterize.    02/04/2020 HRCT Chest  1. There is no significant imaging evidence for fibrotic interstitial lung disease. Scattered groundglass opacities, predominantly peripheral and greatest in the left lower lobe may represent infection (including COVID-19 pneumonia), or possibly more chronic inflammatory process such as eosinophilic lung disease.      2.  Geographic steatosis of the liver with a hyperattenuating 1.3 cm focus in segment 4A. The latter may simply reflect a focal  area of fatty sparing In absence of known malignancy. If clinically relevant, a hepatic ultrasound or MRI could further characterize.       ASSESSMENT and PLAN     Problem List  Severe Eosinophilic Asthma   Chronic Eosinophilic Pneumonia  GERD    Assessment  Mrs. 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         We will wean steroids very slowly. Plan to drop by 1mg  every 2 weeks and re-assess at 10mg  daily. Because of her prolonged high doses of steroids I have told her to get checked for osteoporosis. DEXA ordered and phone number to schedule given.     Plan  - trelegy, azithromycin, montelukast  - Prednisone taper as above  - Dupixent    The patient was seen with Dr. Morene Antu and will return to clinic in 2 months.    Layla Maw MD  Pulmonary and Critical Care Fellow

## 2022-06-22 MED FILL — DUPIXENT 300 MG/2 ML SUBCUTANEOUS PEN INJECTOR: SUBCUTANEOUS | 28 days supply | Qty: 4 | Fill #2

## 2022-07-14 NOTE — Unmapped (Signed)
[facility] Refill Coordination Note    Dana Jacobs, DOB: Oct 27, 1980  Phone: [phone] (home)       All above HIPAA information was verified with patient.         07/14/2022    10:58 AM   Specialty Rx Medication Refill Questionnaire   Which Medications would you like refilled and shipped? Dupixant   Please list all current allergies: None   Have you missed any doses in the last 30 days? No   Have you had any changes to your medication(s) since your last refill? No   How many days remaining of each medication do you have at home? 0   If receiving an injectable medication, next injection date is 07/22/2022   Have you experienced any side effects in the last 30 days? No   Please enter the full address (street address, city, state, zip code) where you would like your medication(s) to be delivered to. [address] [place], [place] [address]   Please specify on which day you would like your medication(s) to arrive. Note: if you need your medication(s) within 3 days, please call the pharmacy to schedule your order at [phone]  07/19/2022   Has your insurance changed since your last refill? No   Would you like a pharmacist to call you to discuss your medication(s)? No   Do you require a signature for your package? (Note: if we are billing Medicare Part B or your order contains a controlled substance, we will require a signature) No         Completed refill call assessment today to schedule patient's medication shipment from the [facility] [phone]).  All relevant notes have been reviewed.       Confirmed patient received a Conservation officer, historic buildings and a Surveyor, mining with first shipment. The patient will receive a drug information handout for each medication shipped and additional FDA Medication Guides as required.         REFERRAL TO PHARMACIST     Referral to the pharmacist: Not needed      [facility]     Shipping address confirmed in Epic.     Delivery Scheduled: Yes, Expected medication delivery date: 07/19/22.     Medication will be delivered via Same Day Courier to the prescription address in Epic WAM.    [facility] Specialty Technician

## 2022-07-19 MED FILL — DUPIXENT 300 MG/2 ML SUBCUTANEOUS PEN INJECTOR: SUBCUTANEOUS | 28 days supply | Qty: 4 | Fill #3

## 2022-08-19 NOTE — Unmapped (Signed)
[facility] Refill Coordination Note    Specialty Medication(s) to be Shipped:   Inflammatory Disorders: Dupixent    Other medication(s) to be shipped: No additional medications requested for fill at this time     Dana Jacobs, DOB: May 29, 1980  Phone: [phone] (home)       All above HIPAA information was verified with patient.     Was a Nurse, learning disability used for this call? No    Completed refill call assessment today to schedule patient's medication shipment from the [facility] [phone]).  All relevant notes have been reviewed.     Specialty medication(s) and dose(s) confirmed: Regimen is correct and unchanged.   Changes to medications: [name] reports no changes at this time.  Changes to insurance: No  New side effects reported not previously addressed with a pharmacist or physician: None reported  Questions for the pharmacist: No    Confirmed patient received a Conservation officer, historic buildings and a Surveyor, mining with first shipment. The patient will receive a drug information handout for each medication shipped and additional FDA Medication Guides as required.       DISEASE/MEDICATION-SPECIFIC INFORMATION        For patients on injectable medications: Patient currently has 0 doses left.  Next injection is scheduled for 08/05.    SPECIALTY MEDICATION ADHERENCE     Medication Adherence    Patient reported X missed doses in the last month: 0  Specialty Medication: DUPIXENT PEN 300 mg/2 mL Pnij (dupilumab)  Patient is on additional specialty medications: No  Patient is on more than two specialty medications: No  Any gaps in refill history greater than 2 weeks in the last 3 months: no  Demonstrates understanding of importance of adherence: yes  Informant: patient  Reliability of informant: reliable  Provider-estimated medication adherence level: good  Patient is at risk for Non-Adherence: No  Reasons for non-adherence: no problems identified  Confirmed plan for next specialty medication refill: delivery by pharmacy  Refills needed for supportive medications: yes, ordered or provider notified          Refill Coordination    Has the Patients' Contact Information Changed: No  Is the Shipping Address Different: No         Were doses missed due to medication being on hold? No    dupixent 300/2 mg/ml: 0 days of medicine on hand       REFERRAL TO PHARMACIST     Referral to the pharmacist: Not needed      [facility]     Shipping address confirmed in Epic.       Delivery Scheduled: Yes, Expected medication delivery date: 08/05.     Medication will be delivered via Same Day Courier to the prescription address in Epic WAM.    Antonietta Barcelona   [facility] Specialty Technician

## 2022-08-19 NOTE — Unmapped (Signed)
The [facility] has made a second and final attempt to reach this patient to refill the following medication:DUPIXENT PEN 300 mg/2 mL Pnij (dupilumab) .      We have left voicemails on the following phone numbers: [phone], have sent a MyChart message, have sent a text message to the following phone numbers: [phone], and have sent a Mychart questionnaire..    Dates contacted: 07/25     08/02  Last scheduled delivery: 07/19/22    The patient may be at risk of non-compliance with this medication. The patient should call the [facility] at [phone]  Option 4, then Option 3: Allergy, Immunology, Pulmonary, Neurology to refill medication.    Kimmerly Lora' W Oscar Forman   [facility] Specialty Technician

## 2022-08-22 MED FILL — DUPIXENT 300 MG/2 ML SUBCUTANEOUS PEN INJECTOR: SUBCUTANEOUS | 28 days supply | Qty: 4 | Fill #4

## 2022-08-26 ENCOUNTER — Ambulatory Visit
Admit: 2022-08-26 | Discharge: 2022-08-26 | Disposition: A | Discharge status: Home / Self Care | Payer: PRIVATE HEALTH INSURANCE | Attending: Emergency Medicine

## 2022-08-26 ENCOUNTER — Emergency Department
Admit: 2022-08-26 | Discharge: 2022-08-26 | Disposition: A | Discharge status: Home / Self Care | Payer: PRIVATE HEALTH INSURANCE | Attending: Emergency Medicine

## 2022-08-26 MED ORDER — MELOXICAM 15 MG TABLET
ORAL_TABLET | Freq: Two times a day (BID) | ORAL | 0 refills | 5 days | Status: CP
Start: 2022-08-26 — End: 2022-08-31

## 2022-08-26 MED ORDER — DIAZEPAM 5 MG TABLET
ORAL_TABLET | Freq: Three times a day (TID) | ORAL | 0 refills | 4 days | Status: CP | PRN
Start: 2022-08-26 — End: 2022-09-05

## 2022-08-26 MED ORDER — LIDOCAINE 4 % TOPICAL PATCH
MEDICATED_PATCH | Freq: Every day | TRANSDERMAL | 0 refills | 5 days | Status: CP
Start: 2022-08-26 — End: 2022-08-31

## 2022-08-26 MED ADMIN — diazePAM (VALIUM) tablet 5 mg: 5 mg | ORAL | @ 07:00:00 | Stop: 2022-08-26

## 2022-08-26 MED ADMIN — oxyCODONE (ROXICODONE) immediate release tablet 10 mg: 10 mg | ORAL | @ 06:00:00 | Stop: 2022-08-26

## 2022-08-26 MED ADMIN — lidocaine 4 % patch 1 patch: 1 | TRANSDERMAL | @ 07:00:00 | Stop: 2022-08-26

## 2022-08-26 MED ADMIN — ketorolac (TORADOL) injection 30 mg: 30 mg | INTRAMUSCULAR | @ 06:00:00 | Stop: 2022-08-26

## 2022-08-26 NOTE — Unmapped (Signed)
Pt states that she is having sharp pain on the L side of her back that started a couple days ago and today she coughed and felt a pop in her back and the pain has been worse since then

## 2022-08-30 NOTE — Unmapped (Signed)
Emergency Department Provider Note       ED Clinical Impression      Final diagnoses:   Chest wall pain (Primary)            Impression, Medical Decision Making, Progress Notes and Critical Care      Impression, Differential Diagnosis and Plan of Care    42 year old female with history as noted below who is presenting for evaluation of back pain.  Her vital signs are currently stable.  Exam as noted below.  Overall consistent with musculoskeletal/nerve pain.  Differentials considered include muscle strain, torn intercostal muscle, herniated disc.  There are no red flags in her history exam that given concern for a central cord etiology such as an epidural abscess or mass compressing her spinal cord.  She has no bowel or bladder incontinence.  No history of IV drug use.  Discussed symptomatic care while here in the emergency department.  Will reassess.  There is no indication for further imaging or workup at this time.      Independent Interpretation of Studies    I have independently interpreted the following studies:  EKG:   X-ray(s):   CT/MRI(s):   Ultrasound(s):     Discussion of Management with other Providers or Support Staff    I discussed the management of this patient with the:  Admitting provider:   Consultant(s):   Radiologist:   ED Pharmacist:  Case Management/Social Work:   Other:     Considerations Regarding Disposition/Escalation of Care and Critical Care    Indications for observation/admission (or consideration of observation/admission) and/or appropriateness for outpatient management:   Patient/Family/Caregiver Discussions:  Prescription Drugs Provided or Considered But Not Given:   Social Determinants of Health which significantly affected care:   Social Determinants of Health     Financial Resource Strain: Medium Risk (01/14/2022)    Overall Financial Resource Strain (CARDIA)     Difficulty of Paying Living Expenses: Somewhat hard   Internet Connectivity: No Internet connectivity concern identified (01/14/2022)    Internet Connectivity     Do you have access to internet services: Yes     How do you connect to the internet: Personal Device at home     Is your internet connection strong enough for you to watch video on your device without major problems?: Yes     Do you have enough data to get through the month?: Yes     Does at least one of the devices have a camera that you can use for video chat?: Yes   Food Insecurity: No Food Insecurity (01/14/2022)    Hunger Vital Sign     Worried About Running Out of Food in the Last Year: Never true     Ran Out of Food in the Last Year: Never true   Tobacco Use: Medium Risk (06/16/2022)    Patient History     Smoking Tobacco Use: Former     Smokeless Tobacco Use: Never     Passive Exposure: Not on file   Housing/Utilities: Low Risk  (01/14/2022)    Housing/Utilities     Within the past 12 months, have you ever stayed: outside, in a car, in a tent, in an overnight shelter, or temporarily in someone else's home (i.e. couch-surfing)?: No     Are you worried about losing your housing?: No     Within the past 12 months, have you been unable to get utilities (heat, electricity) when it was really needed?: No   Alcohol  Use: Not At Risk (01/14/2022)    Alcohol Use     How often do you have a drink containing alcohol?: Never     How many drinks containing alcohol do you have on a typical day when you are drinking?: 3 - 4     How often do you have 5 or more drinks on one occasion?: Never   Transportation Needs: No Transportation Needs (01/14/2022)    PRAPARE - Transportation     Lack of Transportation (Medical): No     Lack of Transportation (Non-Medical): No   Substance Use: Low Risk  (01/14/2022)    Substance Use     Taken prescription drugs for non-medical reasons: Never     Taken illegal drugs: Never     Patient indicated they have taken drugs in the past year for non-medical reasons: Yes, [positive answer(s)]: Not on file   Health Literacy: Low Risk  (04/24/2020) Health Literacy     : Never   Physical Activity: Not on file   Interpersonal Safety: Not At Risk (01/14/2022)    Interpersonal Safety     Unsafe Where You Currently Live: No     Physically Hurt by Anyone: No     Abused by Anyone: No   Stress: No Stress Concern Present (01/14/2022)    [employer] of Occupational Health - Occupational Stress Questionnaire     Feeling of Stress : Only a little   Intimate Partner Violence: Not At Risk (01/14/2022)    Humiliation, Afraid, Rape, and Kick questionnaire     Fear of Current or Ex-Partner: No     Emotionally Abused: No     Physically Abused: No     Sexually Abused: No   Depression: Not at risk (01/14/2022)    PHQ-2     PHQ-2 Score: 0   Social Connections: Not on file         Additional Progress Notes    After multimodal pain management, patient has had only minimal relief.  Her exam still remained stable he is able to ambulate.  On my repeat examination, she does look slightly more comfortable, does feel like she still cannot find a position that is comfortable, has not tried laying down yet.  Comfortable plan with discharge with multimodal pain management.  Again discussed likely etiology of possible intercostal muscle strain versus tear involving a lot of nerve pain.  Overall benign etiologies.  Discussed return precautions.    Portions of this record have been created using Scientist, clinical (histocompatibility and immunogenetics). Dictation errors have been sought, but may not have been identified and corrected.    See chart and resident provider documentation for details.    ____________________________________________         History        Reason for Visit  Back Pain      HPI   Dana Jacobs is a 42 y.o. female history of asthma who is presenting for evaluation of chest wall pain.  Pain is in her left back.  Reports that she had some aching pain on that side for a couple days.  Sore with some movement.  Reports that yesterday she coughed felt a pop and now has had intense pain since. Pain with range of motion, pain with taking a deep breath.  Still is able to walk but with some pain, denies any weakness in the extremities.  No weakness in the upper extremities as well, normal sensation otherwise.  Has not experienced this before in the  past.  No history of disc disease in her spine that she is aware of.  Denies any fevers or chills, no history of trauma otherwise.      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, Anti-K       Patient Active Problem List   Diagnosis    Red blood cell antibody positive    S/P hysterectomy    Anxiety    Severe persistent asthma without complication    Chronic eosinophilic pneumonia (CMS-HCC)    Primary hypertension    Hyperlipidemia    Hepatic steatosis       Past Surgical History:   Procedure Laterality Date    HERNIA REPAIR      umbilical hernia    HYSTERECTOMY      2020    OOPHORECTOMY      2020    PR LAPAROSCOPY W TOT HYSTERECTUTERUS <=250 GRAM  W TUBE/OVARY Bilateral 07/06/2018    Procedure: Exam under anesthesia, total laparoscopic hysterectomy, bilateral salpingectomy,  cystoscopy;  Surgeon: Dorris Carnes, MD;  Location: OR [facility];  Service: Gynecology    PR SIGMOIDOSCOPY W/ENDOSCOPIC US EXAM N/A 03/15/2018    Procedure: Arnell Sieving, WITH ENDOSCOPIC ULTRASOUND EXAM;  Surgeon: Chriss Driver, MD;  [facility]: [facility];  Service: Gastroenterology    WISDOM TOOTH EXTRACTION         No current facility-administered medications for this encounter.    Current Outpatient Medications:     albuterol 2.5 mg /3 mL (0.083 %) nebulizer solution, Inhale 3 mL (2.5 mg total) by nebulization every four (4) hours as needed for wheezing., Disp: 90 mL, Rfl: 11    albuterol HFA 90 mcg/actuation inhaler, Inhale 2 puffs every four (4) hours as needed for wheezing., Disp: 8 g, Rfl: 11    azithromycin (ZITHROMAX) 250 MG tablet, Take 1 tablet (250 mg total) by mouth daily., Disp: 90 tablet, Rfl: 3    diazePAM (VALIUM) 5 MG tablet, Take 1 tablet (5 mg total) by mouth every eight (8) hours as needed for anxiety for up to 10 days., Disp: 10 tablet, Rfl: 0    dupilumab (DUPIXENT PEN) 300 mg/2 mL PnIj, Inject the contents of 1 pen (300mg ) under the skin every 14 days as maintenance, Disp: 4 mL, Rfl: 11    empty container Misc, Use as directed to dispose of Dupixent pens, Disp: 1 each, Rfl: 2    EPINEPHrine (EPIPEN 2-PAK) 0.3 mg/0.3 mL injection, Inject 0.3 mL (0.3 mg total) into the muscle as needed for anaphylaxis., Disp: 2 each, Rfl: 2    fluticasone-umeclidin-vilanter (TRELEGY ELLIPTA) 200-62.5-25 mcg DsDv, Inhale 1 puff daily., Disp: 60 each, Rfl: 11    ibuprofen (MOTRIN) 600 MG tablet, Take 1 tablet (600 mg total) by mouth every six (6) hours as needed., Disp: 60 tablet, Rfl: 11    lidocaine 2% viscous (XYLOCAINE) 2 % Soln, 5 mL swished in the mouth and spit out no more frequently than every 3 hours, Disp: 100 mL, Rfl: 0    lidocaine 4 % patch, Place 1 patch on the skin daily for 5 days., Disp: 5 patch, Rfl: 0    meloxicam (MOBIC) 15 MG tablet, Take 0.5  tablets (7.5 mg total) by mouth two (2) times a day for 5 days., Disp: 5 tablet, Rfl: 0    montelukast (SINGULAIR) 10 mg tablet, Take 1 tablet (10 mg total) by mouth daily., Disp: 90 tablet, Rfl: 3    pantoprazole (PROTONIX) 40 MG tablet, Take 1 tablet (40 mg total) by mouth two (2) times a day., Disp: 120 tablet, Rfl: 2    predniSONE (DELTASONE) 1 MG tablet, Take 1 tablet (1 mg total) by mouth daily., Disp: 90 tablet, Rfl: 11    predniSONE (DELTASONE) 5 MG tablet, Take 2 tablets (10 mg total) by mouth daily., Disp: 120 tablet, Rfl: 5    rosuvastatin (CRESTOR) 5 MG tablet, Take 2 tablets (10 mg total) by mouth nightly., Disp: 360 tablet, Rfl: 0    Allergies  Patient has no known allergies.    Family History   Problem Relation Age of Onset    Crohn's disease Mother     Breast cancer Neg Hx     Ovarian cancer Neg Hx        Social History  Social History     Tobacco Use    Smoking status: Former     Current packs/day: 0.00     Types: Cigarettes     Start date: 01/17/2010     Quit date: 01/17/2018     Years since quitting: 4.6    Smokeless tobacco: Never   Vaping Use    Vaping status: Former    Substances: Nicotine    Devices: Disposable   Substance Use Topics    Alcohol use: Yes    Drug use: Not Currently       Review of Systems    Constitutional: Negative for fever.  Eyes: Negative for visual changes.  ENT: Negative for sore throat.  Cardiovascular: Negative for chest pain.  Respiratory: Negative for shortness of breath.  Gastrointestinal: Negative for abdominal pain, vomiting or diarrhea.       Physical Exam       ED Triage Vitals   Enc Vitals Group      BP 08/25/22 2129 129/96      Heart Rate 08/25/22 2129 108      SpO2 Pulse 08/26/22 0139 79      Resp 08/25/22 2129 16      Temp 08/25/22 2129 36.8 ??C (98.3 ??F)      Temp Source 08/25/22 2129 Oral      SpO2 08/25/22 2129 100 %      Weight --       Height --       Head Circumference --       Peak Flow --       Pain Score --       Pain Loc --       Pain Education --       Exclude from Growth Chart --        Constitutional: Alert and oriented. in no distress.  Eyes: Conjunctivae are normal.  ENT       Head: Normocephalic and atraumatic.       Nose: No congestion.       Mouth/Throat: Mucous membranes are moist.       Neck: No stridor.  Hematological/Lymphatic/Immunilogical: No cervical lymphadenopathy.  Cardiovascular: Normal rate, regular rhythm. Normal and symmetric distal pulses are present in all extremities.  Respiratory: Normal respiratory effort. Breath sounds are normal.  Gastrointestinal: Soft and nontender. There is no CVA tenderness.  Musculoskeletal: Normal range of motion in  all extremities.       Right lower leg: No tenderness or edema.       Left lower leg: No tenderness or edema.  Patient has tenderness palpation of her left paraspinal lower thoracic and lumbar region, no midline tenderness.  5 out of 5 strength in upper and lower extremities, patient does find difficulty finding a comfortable place to sit during the examination as she is uncomfortable, pain is worse with movement and palpation.  Neurologic: Normal speech and language. No gross focal neurologic deficits are appreciated.  Skin: Skin is warm, dry and intact. No rash noted.  Psychiatric: Mood and affect are normal. Speech and behavior are normal.             Harriet Pho, MD  08/29/22 2136

## 2022-09-08 ENCOUNTER — Ambulatory Visit: Admit: 2022-09-08 | Discharge: 2022-09-09 | Payer: PRIVATE HEALTH INSURANCE

## 2022-09-08 DIAGNOSIS — R109 Unspecified abdominal pain: Principal | ICD-10-CM

## 2022-09-08 DIAGNOSIS — R053 Chronic cough: Principal | ICD-10-CM

## 2022-09-08 DIAGNOSIS — R0789 Other chest pain: Principal | ICD-10-CM

## 2022-09-08 DIAGNOSIS — R0781 Pleurodynia: Principal | ICD-10-CM

## 2022-09-08 DIAGNOSIS — M546 Pain in thoracic spine: Principal | ICD-10-CM

## 2022-09-08 LAB — CBC W/ AUTO DIFF
BASOPHILS ABSOLUTE COUNT: 0.1 10*9/L (ref 0.0–0.1)
BASOPHILS RELATIVE PERCENT: 0.8 %
EOSINOPHILS ABSOLUTE COUNT: 1.2 10*9/L — ABNORMAL HIGH (ref 0.0–0.5)
EOSINOPHILS RELATIVE PERCENT: 13 %
HEMATOCRIT: 41.1 % (ref 34.0–44.0)
HEMOGLOBIN: 14.1 g/dL (ref 11.3–14.9)
LYMPHOCYTES ABSOLUTE COUNT: 3 10*9/L (ref 1.1–3.6)
LYMPHOCYTES RELATIVE PERCENT: 33.7 %
MEAN CORPUSCULAR HEMOGLOBIN CONC: 34.2 g/dL (ref 32.0–36.0)
MEAN CORPUSCULAR HEMOGLOBIN: 33.4 pg — ABNORMAL HIGH (ref 25.9–32.4)
MEAN CORPUSCULAR VOLUME: 97.5 fL — ABNORMAL HIGH (ref 77.6–95.7)
MEAN PLATELET VOLUME: 8.1 fL (ref 6.8–10.7)
MONOCYTES ABSOLUTE COUNT: 0.5 10*9/L (ref 0.3–0.8)
MONOCYTES RELATIVE PERCENT: 6 %
NEUTROPHILS ABSOLUTE COUNT: 4.2 10*9/L (ref 1.8–7.8)
NEUTROPHILS RELATIVE PERCENT: 46.5 %
PLATELET COUNT: 256 10*9/L (ref 150–450)
RED BLOOD CELL COUNT: 4.22 10*12/L (ref 3.95–5.13)
RED CELL DISTRIBUTION WIDTH: 12.3 % (ref 12.2–15.2)
WBC ADJUSTED: 9 10*9/L (ref 3.6–11.2)

## 2022-09-08 LAB — COMPREHENSIVE METABOLIC PANEL
ALBUMIN: 4.1 g/dL (ref 3.4–5.0)
ALKALINE PHOSPHATASE: 56 U/L (ref 46–116)
ALT (SGPT): 36 U/L (ref 10–49)
ANION GAP: 6 mmol/L (ref 5–14)
AST (SGOT): 26 U/L (ref <=34)
BILIRUBIN TOTAL: 0.9 mg/dL (ref 0.3–1.2)
BLOOD UREA NITROGEN: 10 mg/dL (ref 9–23)
BUN / CREAT RATIO: 16
CALCIUM: 9.7 mg/dL (ref 8.7–10.4)
CHLORIDE: 104 mmol/L (ref 98–107)
CO2: 27 mmol/L (ref 20.0–31.0)
CREATININE: 0.61 mg/dL
EGFR CKD-EPI (2021) FEMALE: >90 mL/min/{1.73_m2} (ref >=60)
GLUCOSE RANDOM: 76 mg/dL (ref 70–99)
POTASSIUM: 3.9 mmol/L (ref 3.4–4.8)
PROTEIN TOTAL: 8 g/dL (ref 5.7–8.2)
SODIUM: 137 mmol/L (ref 135–145)

## 2022-09-08 MED ORDER — INDOMETHACIN 25 MG CAPSULE
ORAL_CAPSULE | Freq: Three times a day (TID) | ORAL | 0 refills | 10 days | Status: CP
Start: 2022-09-08 — End: 2022-09-18

## 2022-09-08 NOTE — Unmapped (Signed)
[facility] Clinical Assessment & Refill Coordination Note    Dana Jacobs, DOB: 1980-08-11  Phone: [phone] (home)     All above HIPAA information was verified with patient.     Was a Nurse, learning disability used for this call? No    Specialty Medication(s):   CF/Pulmonary/Asthma: Dupixent 300mg /26mL     Current Outpatient Medications   Medication Sig Dispense Refill    albuterol 2.5 mg /3 mL (0.083 %) nebulizer solution Inhale 3 mL (2.5 mg total) by nebulization every four (4) hours as needed for wheezing. 90 mL 11    albuterol HFA 90 mcg/actuation inhaler Inhale 2 puffs every four (4) hours as needed for wheezing. 8 g 11    azithromycin (ZITHROMAX) 250 MG tablet Take 1 tablet (250 mg total) by mouth daily. 90 tablet 3    dupilumab (DUPIXENT PEN) 300 mg/2 mL PnIj Inject the contents of 1 pen (300mg ) under the skin every 14 days as maintenance 4 mL 11    empty container Misc Use as directed to dispose of Dupixent pens 1 each 2    EPINEPHrine (EPIPEN 2-PAK) 0.3 mg/0.3 mL injection Inject 0.3 mL (0.3 mg total) into the muscle as needed for anaphylaxis. 2 each 2    fluticasone-umeclidin-vilanter (TRELEGY ELLIPTA) 200-62.5-25 mcg DsDv Inhale 1 puff daily. 60 each 11    ibuprofen (MOTRIN) 600 MG tablet Take 1 tablet (600 mg total) by mouth every six (6) hours as needed. 60 tablet 11    indomethacin (INDOCIN) 25 MG capsule Take 1 capsule (25 mg total) by mouth Three (3) times a day with a meal for 10 days. 30 capsule 0    lidocaine 2% viscous (XYLOCAINE) 2 % Soln 5 mL swished in the mouth and spit out no more frequently than every 3 hours 100 mL 0    montelukast (SINGULAIR) 10 mg tablet Take 1 tablet (10 mg total) by mouth daily. 90 tablet 3    pantoprazole (PROTONIX) 40 MG tablet Take 1 tablet (40 mg total) by mouth two (2) times a day. 120 tablet 2    predniSONE (DELTASONE) 1 MG tablet Take 1 tablet (1 mg total) by mouth daily. 90 tablet 11    predniSONE (DELTASONE) 5 MG tablet Take 2 tablets (10 mg total) by mouth daily. 120 tablet 5    rosuvastatin (CRESTOR) 5 MG tablet Take 2 tablets (10 mg total) by mouth nightly. 360 tablet 0     No current facility-administered medications for this visit.        Changes to medications: [name] reports starting the following medications: indomethacin     No Known Allergies    Changes to allergies: No    SPECIALTY MEDICATION ADHERENCE     Dupixent 300  mg/60mL : 1 doses of medicine on hand     Medication Adherence    Patient reported X missed doses in the last month: 0  Specialty Medication: Dupixent 300 mg/38mL Q14d  Patient is on additional specialty medications: No  Patient is on more than two specialty medications: No  Any gaps in refill history greater than 2 weeks in the last 3 months: no  Demonstrates understanding of importance of adherence: yes  Informant: patient          Specialty medication(s) dose(s) confirmed: Regimen is correct and unchanged.     Are there any concerns with adherence? No    Adherence counseling provided? Not needed    CLINICAL MANAGEMENT AND INTERVENTION  Clinical Benefit Assessment:    Do you feel the medicine is effective or helping your condition? Yes but still experiencing asthma exacerbations requiring steroid use.     Clinical Benefit counseling provided? Reasonable expectations discussed: We discussed that most see full clinical benefit after 6 months of taking Dupixent. I encouraged her to schedule an appt with pulmonologist to reassess treatment regimen in case an alternative biologic should be considered.     Adverse Effects Assessment:    Are you experiencing any side effects? Yes, patient reports experiencing back pain since starting Dupixent. Her PCP prescribed indomethacin for pain relief. Side effect counseling provided: We discuss there have some case reports of back pain with Dupixent but overall prevalence is low. She also mentioned experiencing an recent injury that worsen back pain. I encouraged her to try indomethacin to see if it helps with pain relief and make a follow-up appt with pulmonologist to reassess Dupixent's appropriateness. She is also using rescue inhaler 3-4x/day and needing prednisone PRN so an alternate biologic could be considered.       Are you experiencing difficulty administering your medicine? No    Quality of Life Assessment:    Quality of Life    Rheumatology  Oncology  Dermatology  Cystic Fibrosis          How many days over the past month did your asthma  keep you from your normal activities? For example, brushing your teeth or getting up in the morning. Patient declined to answer    Have you discussed this with your provider? Not needed    Acute Infection Status:    Acute infections noted within Epic:  No active infections  Patient reported infection: None    Therapy Appropriateness:    Is therapy appropriate based on current medication list, adverse reactions, adherence, clinical benefit and progress toward achieving therapeutic goals? Yes, therapy is appropriate and should be continued     DISEASE/MEDICATION-SPECIFIC INFORMATION      For patients on injectable medications: Patient currently has 1 doses left.  Next injection is scheduled for ~8/24.    Asthma and Allergy: Have you had an asthma exacerbation in the last 30 days? Yes, last week  Have you needed to use your rescue inhaler more often than usual in the last 30 days? Yes, using rescue inhaler 3-4x/day  Have you needed to take steroids for your asthma in the last 30 days? Yes, taking prednisone taper - currently taking 6mg  daily    PATIENT SPECIFIC NEEDS     Does the patient have any physical, cognitive, or cultural barriers? No    Is the patient high risk? No    Did the patient require a clinical intervention? No    Does the patient require physician intervention or other additional services (i.e., nutrition, smoking cessation, social work)? No    SOCIAL DETERMINANTS OF HEALTH     At the [facility], we have learned that life circumstances - like trouble affording food, housing, utilities, or transportation can affect the health of many of our patients.   That is why we wanted to ask: are you currently experiencing any life circumstances that are negatively impacting your health and/or quality of life? Patient declined to answer    Social Determinants of Health     Financial Resource Strain: Medium Risk (01/14/2022)    Overall Financial Resource Strain (CARDIA)     Difficulty of Paying Living Expenses: Somewhat hard   Internet Connectivity: No Internet connectivity concern identified (  01/14/2022)    Internet Connectivity     Do you have access to internet services: Yes     How do you connect to the internet: Personal Device at home     Is your internet connection strong enough for you to watch video on your device without major problems?: Yes     Do you have enough data to get through the month?: Yes     Does at least one of the devices have a camera that you can use for video chat?: Yes   Food Insecurity: No Food Insecurity (09/08/2022)    Hunger Vital Sign     Worried About Running Out of Food in the Last Year: Never true     Ran Out of Food in the Last Year: Never true   Tobacco Use: Medium Risk (09/08/2022)    Patient History     Smoking Tobacco Use: Former     Smokeless Tobacco Use: Never     Passive Exposure: Not on file   Housing/Utilities: Low Risk  (09/08/2022)    Housing/Utilities     Within the past 12 months, have you ever stayed: outside, in a car, in a tent, in an overnight shelter, or temporarily in someone else's home (i.e. couch-surfing)?: No     Are you worried about losing your housing?: No     Within the past 12 months, have you been unable to get utilities (heat, electricity) when it was really needed?: No   Alcohol Use: Not At Risk (09/08/2022)    Alcohol Use     How often do you have a drink containing alcohol?: Never     How many drinks containing alcohol do you have on a typical day when you are drinking?: 1 - 2 How often do you have 5 or more drinks on one occasion?: Never   Transportation Needs: No Transportation Needs (09/08/2022)    PRAPARE - Transportation     Lack of Transportation (Medical): No     Lack of Transportation (Non-Medical): No   Substance Use: Low Risk  (09/08/2022)    Substance Use     Taken prescription drugs for non-medical reasons: Never     Taken illegal drugs: Never     Patient indicated they have taken drugs in the past year for non-medical reasons: Yes, [positive answer(s)]: Not on file   Health Literacy: Low Risk  (04/24/2020)    Health Literacy     : Never   Physical Activity: Not on file   Interpersonal Safety: Not At Risk (01/14/2022)    Interpersonal Safety     Unsafe Where You Currently Live: No     Physically Hurt by Anyone: No     Abused by Anyone: No   Stress: No Stress Concern Present (01/14/2022)    [employer] of Occupational Health - Occupational Stress Questionnaire     Feeling of Stress : Only a little   Intimate Partner Violence: Not At Risk (01/14/2022)    Humiliation, Afraid, Rape, and Kick questionnaire     Fear of Current or Ex-Partner: No     Emotionally Abused: No     Physically Abused: No     Sexually Abused: No   Depression: Not at risk (01/14/2022)    PHQ-2     PHQ-2 Score: 0   Social Connections: Not on file       Would you be willing to receive help with any of the needs that you have identified today? Not applicable  SHIPPING     Specialty Medication(s) to be Shipped:   CF/Pulmonary/Asthma: Dupixent 300 mg    Other medication(s) to be shipped: No additional medications requested for fill at this time     Changes to insurance: No    Delivery Scheduled: Yes, Expected medication delivery date: 09/16/22.     Medication will be delivered via Same Day Courier to the confirmed prescription address in [facility].    The patient will receive a drug information handout for each medication shipped and additional FDA Medication Guides as required.  Verified that patient has previously received a Conservation officer, historic buildings and a Surveyor, mining.    The patient or caregiver noted above participated in the development of this care plan and knows that they can request review of or adjustments to the care plan at any time.      All of the patient's questions and concerns have been addressed.    [name], PharmD   [facility] Specialty Pharmacist

## 2022-09-08 NOTE — Unmapped (Signed)
Poc urinalysis was obtained. Results were entered into the chart. The patient tolerated it well/

## 2022-09-11 ENCOUNTER — Ambulatory Visit: Admit: 2022-09-11 | Discharge: 2022-09-12 | Payer: PRIVATE HEALTH INSURANCE

## 2022-09-11 MED ADMIN — iohexol (OMNIPAQUE) 350 mg iodine/mL solution 75 mL: 75 mL | INTRAVENOUS | @ 14:00:00 | Stop: 2022-09-11

## 2022-09-12 DIAGNOSIS — Z7952 Long term (current) use of systemic steroids: Principal | ICD-10-CM

## 2022-09-12 DIAGNOSIS — S2232XD Fracture of one rib, left side, subsequent encounter for fracture with routine healing: Principal | ICD-10-CM

## 2022-09-12 DIAGNOSIS — D84821 Immunosuppression due to chronic steroid use (CMS-HCC): Principal | ICD-10-CM

## 2022-09-12 DIAGNOSIS — T380X5A Adverse effect of glucocorticoids and synthetic analogues, initial encounter: Principal | ICD-10-CM

## 2022-09-12 MED ORDER — OXYCODONE 5 MG TABLET
ORAL_TABLET | Freq: Three times a day (TID) | ORAL | 0 refills | 4 days | Status: CP | PRN
Start: 2022-09-12 — End: 2022-09-17

## 2022-09-12 NOTE — Unmapped (Signed)
Contacted patient regarding CT results. She confirmed her identity     She can not think of any injury that might have caused this. She notes that when she was coughing then she heard a pop. She does note some improvement in her pain from the indomethacin.     We did discuss her CT findings which noted: 2. Subacute nondisplaced fracture involving the posterior aspect of the 11th rib on the left   She verbalized she figured this was the cause.   She is requesting something else for pain. Discussed opioids are not necessarily indicated but could provide very short course for pain relief. Discussed we would not provide additional refills after this.     Discussed concern that she had fractured her rib from coughing and recommended obtaining DEXA scan (concern for osteopenia/osteoporosis given her long term PO steroid use). She is agreeable to obtaining. Order placed.       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        We will discuss additional findings at her appointment this week (such as increasing her cholesterol medication)

## 2022-09-13 NOTE — Unmapped (Signed)
Assessment and Plan:     Tmia Strzelecki. Shipes was seen today for follow-up.    Diagnoses and all orders for this visit:    Pt reports that 2 weeks prior to going to ED she had a severe cough and during one of the coughing episode she felt a pop in the left side of her back   Since that time, she has had a consistent cough with sharp pain in left back/flank/rib cage with cough/any movement/laying on back  Picking up anything hurts her as well   She has pain with deep taking deep breath but denies [facility] or DOE     Pt went to ED 08/26/2022 and xray showed no fracture or pneumothorax     Pt has significant amt of tenderness with light palpation to left of midline lower thoracic spine/flank region extending into rib cage under left breast     She has tried lidocaine patches, IBU, Tyl, Valium, Meloxicam all without relief     I have low suspicion for pericarditis as there is no SHOB/CP/palpitations/fever/fatigue/sxs of illness  Low suspicion for PE as Wells criteria neg - 0  Low suspicion of pleurisy as no friction rub heard and LCTAB   Low suspicion for urinary cause but will obtain urinalysis     Pt denies injury/falls     Could be infectious process - Will obtain CBC with diff, CMP  Will also obtain CT chest with contrast to further eval     Will give indomethacin and have follow up with PCP     Flank pain  -     POCT urinalysis dipstick  -     CBC w/ Differential  -     Comprehensive Metabolic Panel  -     CT Chest W Contrast; Future    Acute right-sided thoracic back pain  -     Ambulatory referral to Physical Therapy; Future  -     indomethacin (INDOCIN) 25 MG capsule; Take 1 capsule (25 mg total) by mouth Three (3) times a day with a meal for 10 days.  -     CBC w/ Differential  -     CT Chest W Contrast; Future    Chronic cough  -     CBC w/ Differential  -     Comprehensive Metabolic Panel  -     CT Chest W Contrast; Future    Chest wall pain  -     CT Chest W Contrast; Future    Rib pain  -     CT Chest W Contrast; Future         I personally spent 30 minutes face-to-face and non-face-to-face in the care of this patient, which includes all pre, intra, and post visit time on the date of service.  All documented time was specific to the E/M visit and does not include any procedures that may have been performed.      Return in about 1 week (around 09/15/2022) for Recheck with Mangel .    Subjective:     HPI: Rodericka Werking is a 42 y.o. female here for   Chief Complaint   Patient presents with    Follow-up     ER  Having back left side pain in the kidney area.  Patient states that she feels like it is swelling and some nausea when pain increases.   When laying on her back she feels like a ball is in her back   :  Valentina Shaggy. Ritch was seen today for follow-up.    Diagnoses and all orders for this visit:    Pt reports that 2 weeks prior to going to ED she had a severe cough and during one of the coughing episode she felt a pop in the left side of her back   Since that time, she has had a consistent cough with sharp pain in left back/flank/rib cage with cough/any movement/laying on back  Picking up anything hurts her as well   She has pain with deep taking deep breath but denies [facility] or DOE     Pt went to ED 08/26/2022 and xray showed no fracture or pneumothorax     Pt has significant amt of tenderness with light palpation to left of midline lower thoracic spine/flank region extending into rib cage under left breast     She has tried lidocaine patches, IBU, Tyl, Valium, Meloxicam all without relief     I have low suspicion for pericarditis as there is no SHOB/CP/palpitations/fever/fatigue/sxs of illness  Low suspicion for PE as Wells criteria neg - 0  Low suspicion of pleurisy as no friction rub heard and LCTAB   Low suspicion for urinary cause but will obtain urinalysis     Pt denies injury/falls     Could be infectious process - Will obtain CBC with diff, CMP  Will also obtain CT chest with contrast to further eval     Will give indomethacin and have follow up with PCP     Flank pain  -     POCT urinalysis dipstick  -     CBC w/ Differential  -     Comprehensive Metabolic Panel  -     CT Chest W Contrast; Future    Acute right-sided thoracic back pain  -     Ambulatory referral to Physical Therapy; Future  -     indomethacin (INDOCIN) 25 MG capsule; Take 1 capsule (25 mg total) by mouth Three (3) times a day with a meal for 10 days.  -     CBC w/ Differential  -     CT Chest W Contrast; Future    Chronic cough  -     CBC w/ Differential  -     Comprehensive Metabolic Panel  -     CT Chest W Contrast; Future    Chest wall pain  -     CT Chest W Contrast; Future    Rib pain  -     CT Chest W Contrast; Future          ROS:   Review of Systems   Constitutional:  Negative for activity change, appetite change, fatigue and unexpected weight change.   Respiratory:  Positive for cough and shortness of breath. Negative for chest tightness.    Cardiovascular:  Negative for chest pain and palpitations.   Gastrointestinal:  Negative for abdominal pain.   Genitourinary:  Positive for flank pain.   Musculoskeletal:  Positive for back pain.   Neurological:  Negative for dizziness, light-headedness and headaches.   Psychiatric/Behavioral:  Positive for sleep disturbance. The patient is nervous/anxious.         Review of systems negative unless otherwise noted as per HPI    Objective:     Visit Vitals  BP 160/98   Pulse 83   Temp 36.8 ??C (98.3 ??F) (Oral)   Wt 63.6 kg (140 lb 4.8 oz)   LMP 06/06/2018 (Approximate)   SpO2  98%   BMI 25.66 kg/m??          09/08/22 1024   PainSc: 10-Worst pain ever        Physical Exam  Constitutional:       General: She is not in acute distress.     Appearance: Normal appearance. She is well-developed and well-groomed. She is not ill-appearing.   HENT:      Head: Normocephalic and atraumatic.      Mouth/Throat:      Lips: Pink.      Mouth: Mucous membranes are moist.   Eyes:      Conjunctiva/sclera: Conjunctivae normal.   Cardiovascular:      Rate and Rhythm: Normal rate and regular rhythm.      Heart sounds: Normal heart sounds, S1 normal and S2 normal.   Pulmonary:      Effort: Pulmonary effort is normal.      Breath sounds: Normal breath sounds and air entry.   Abdominal:      Tenderness: There is no right CVA tenderness or left CVA tenderness.   Musculoskeletal:      Thoracic back: Tenderness (to left of midline more flank) and bony tenderness present. No swelling, edema, deformity, signs of trauma or spasms. Decreased range of motion.      Comments: Significant amt of tenderness with light palpation to left of midline lower thoracic spine/flank region extending into rib cage under left breast    Skin:     General: Skin is warm and dry.   Neurological:      Mental Status: She is alert and oriented to person, place, and time.   Psychiatric:         Attention and Perception: Attention and perception normal.         Mood and Affect: Mood and affect normal.         Speech: Speech normal.         Behavior: Behavior normal. Behavior is cooperative.         Thought Content: Thought content normal.          [facility]:     Medication adherence and barriers to the treatment plan have been addressed. Opportunities to optimize healthy behaviors have been discussed. Patient / caregiver voiced understanding.

## 2022-09-15 ENCOUNTER — Ambulatory Visit
Admit: 2022-09-15 | Discharge: 2022-09-16 | Payer: PRIVATE HEALTH INSURANCE | Attending: Student in an Organized Health Care Education/Training Program | Primary: Student in an Organized Health Care Education/Training Program

## 2022-09-15 DIAGNOSIS — S2232XD Fracture of one rib, left side, subsequent encounter for fracture with routine healing: Principal | ICD-10-CM

## 2022-09-15 DIAGNOSIS — E559 Vitamin D deficiency, unspecified: Principal | ICD-10-CM

## 2022-09-15 DIAGNOSIS — E782 Mixed hyperlipidemia: Principal | ICD-10-CM

## 2022-09-15 DIAGNOSIS — I10 Essential (primary) hypertension: Principal | ICD-10-CM

## 2022-09-15 DIAGNOSIS — K760 Fatty (change of) liver, not elsewhere classified: Principal | ICD-10-CM

## 2022-09-15 DIAGNOSIS — Z7952 Long term (current) use of systemic steroids: Principal | ICD-10-CM

## 2022-09-15 DIAGNOSIS — D84821 Immunosuppression due to chronic steroid use (CMS-HCC): Principal | ICD-10-CM

## 2022-09-15 DIAGNOSIS — T380X5A Adverse effect of glucocorticoids and synthetic analogues, initial encounter: Principal | ICD-10-CM

## 2022-09-15 NOTE — Unmapped (Signed)
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 ER visit on 08/26/2022 revealed no fracture or pneumothorax. However her pain persisted and was revaluated on 09/08/2022. CT Chest w/ contrast obtained on 09/11/2022: Subacute fracture involving the posterior aspect of the 11th rib on the left   -She has tried multiple therapies (Valium, Lidocaine patches, Tylenol, Meloxicam, Indomethacin) without relief of her symptoms. I did prescribe her with a VERY short course of Roxicodone to help provide pain relief but she has not taken this yet.   -Discussed with her that unfortunately with rib fractures, there is not much more further management/treatment options outside of pain control. She verbalized understanding to this  -We will continue use of Lidocaine patches and discussed iscussed more routine use of medication for pain (advised Tylenol 500-1000mg  every 8-12 hours alternating with Ibuprofen 400-600mg  every 8-12 hours. Additionally advised to take the Roxicodone 5mg  at night for severe pain  -Encouraged to use Incentive Spirometry (which she does have a home)    Immunosuppression   -Given her history of severe persistent asthma, she has been on PO steroids chronically. Given this, postmenopausal state/hysterectomy in 2020 and her most recent rib fracture (which occurred after only coughing and no injury or trauma), discussed concern for bone density/health.   -DEXA scan was ordered and to be performed on 09/16/2022    Hypertension, Hyperlipidemia   BP Readings from Last 3 Encounters:   09/15/22 130/86   09/08/22 160/98   08/26/22 133/85   -Her Blood pressure is better compared to before (likely due to decrease pain)   -Current ASCVD risk: 1.8%. However her lipid panel from 12/2021 was very abnormal with LDL >200  -She reports compliance to Rosuvastatin 10mg  every day.   -We will obtain Lipid panel and likely increase Rosuvastatin to 20mg  every day pending results.  -Continue to monitor BP off medications for now    Vitamin D Deficiency   -History of low Vitamin D (15.9 on 02/26/20 to 20 on 01/14/2022)  -Repeat Vitamin D    HEALTH MAINTENANCE  -Mammogram 01/20/22: BI-RADS Category 1, Repeat 01/2023  -Hysterectomy in 2020. Discussed we can continue screening with pap smears for up to 10-20 years after hysterectomy but not necessary unless had abnormal pap in the past. Last pap per Epic was 2019 NIL and HPV negative  -Underwent Lower EUS in 2020:  The rectum was normal.  The sigmoid colon was normal.  An anechoic lesion suggestive of a cyst was identified in the  right-lateral perirectal space. The lesion measured 32 mm 33. There was a single compartment The outer wall of the lesion was thin. There was no associated mass. There was no internal debris within the fluid-filled cavity. This lesion abuts colon but does not invade wall. If she continues to have bowel concerns, would recommend colonoscopy to  further investigate. CRC screening to begin 01/2025 unless otherwise indicated  -Hepatitis C and HIV  negative 11/2017    Return in about 3 months (around 12/16/2022) for chronic care.    Subjective:  Subjective    HPI: Dana Jacobs is a 42 y.o. female here for follow up     -She was seen on 09/08/2022 as she had persistent pain. CT Chest was obtained which     She notes that driving and sleeping are very painful   She des have a lidocaine path on now   She is not sure if the Indomethacin is having a positive effect anymore    In the last month she has had 4 asthma attacks while being on Dupixent   She is only taking steroids PRN       ROS:  Review of systems negative unless otherwise noted as per HPI.    Objective:  Objective    Vitals:    09/15/22 1507   BP: 130/86   Pulse: 84   Temp: 36.2 ??C (97.2 ??F)   SpO2: 98%     Body mass index is 25.61 kg/m??.    Physical Exam:  Physical Exam  Constitutional:       Appearance: Normal appearance. She is not ill-appearing or diaphoretic.   HENT:      Head: Normocephalic and atraumatic.      Nose: Nose normal.      Mouth/Throat:      Comments: Wearing face mask  Eyes:      General:         Right eye: No discharge.         Left eye: No discharge.      Extraocular Movements: Extraocular movements intact.      Conjunctiva/sclera: Conjunctivae normal.   Cardiovascular:      Rate and Rhythm: Normal rate and regular rhythm.   Pulmonary:      Comments: She does have decreased air movement and pain with deep inspiration. However no conversational dyspnea or increased work of breathing  Musculoskeletal:      Right lower leg: No edema.      Left lower leg: No edema.      Comments: There is TTP to left back/flank. However no ecchymosis, swelling or erythema present.    Neurological:      General: No focal deficit present.      Mental Status: She is alert and oriented to person, place, and time. Mental status is at baseline.      Cranial Nerves: No cranial nerve deficit.      Motor: No weakness.

## 2022-09-15 NOTE — Unmapped (Addendum)
Start Taking TYLENOL 500-1000mg  every 8 hours (MAX dose for tylenol is 300mg )   It is okay to take Ibuprofen 400-600mg  every 8 hours as well

## 2022-09-16 ENCOUNTER — Ambulatory Visit: Admit: 2022-09-16 | Discharge: 2022-09-17 | Payer: PRIVATE HEALTH INSURANCE

## 2022-09-16 DIAGNOSIS — E785 Hyperlipidemia, unspecified: Principal | ICD-10-CM

## 2022-09-16 DIAGNOSIS — E781 Pure hyperglyceridemia: Principal | ICD-10-CM

## 2022-09-16 LAB — LIPID PANEL
CHOLESTEROL/HDL RATIO SCREEN: 5.1 — ABNORMAL HIGH (ref 1.0–4.5)
CHOLESTEROL: 345 mg/dL — ABNORMAL HIGH (ref <=200)
HDL CHOLESTEROL: 68 mg/dL — ABNORMAL HIGH (ref 40–60)
LDL CHOLESTEROL CALCULATED: 234 mg/dL — ABNORMAL HIGH (ref 40–99)
NON-HDL CHOLESTEROL: 277 mg/dL — ABNORMAL HIGH (ref 70–130)
TRIGLYCERIDES: 216 mg/dL — ABNORMAL HIGH (ref 0–150)
VLDL CHOLESTEROL CAL: 43.2 mg/dL — ABNORMAL HIGH (ref 9–37)

## 2022-09-16 MED ORDER — ROSUVASTATIN 20 MG TABLET
ORAL_TABLET | Freq: Every day | ORAL | 3 refills | 90 days | Status: CP
Start: 2022-09-16 — End: 2023-09-16

## 2022-09-16 MED FILL — DUPIXENT 300 MG/2 ML SUBCUTANEOUS PEN INJECTOR: SUBCUTANEOUS | 28 days supply | Qty: 4 | Fill #5

## 2022-09-21 LAB — VITAMIN D 25 HYDROXY: VITAMIN D, TOTAL (25OH): 20.9 ng/mL (ref 20.0–80.0)

## 2022-10-04 NOTE — Unmapped (Signed)
[facility] Delivery Pharm[facility] Refill Coordination Note    Dana Jacobs, DOB: [birth year]  Phone: [phone] (home)       All above HIPAA information was verified with patient.         10/04/2022    11:27 AM   Specialty Rx Medication Refill Questionnaire   Which Medications would you like refilled and shipped? Dupixent   Please list all current allergies: None   Have you missed any doses in the last 30 days? No   Have you had any changes to your medication(s) since your last refill? No   How many days remaining of each medication do you have at home? 1   If receiving an injectable medication, next injection date is 10/11/2022   Have you experienced any side effects in the last 30 days? No   Please enter the full address (street address, city, state, zip code) where you would like your medication(s) to be delivered to. [address] [place], [place] [address]   Please specify on which day you would like your medication(s) to arrive. Note: if you need your medication(s) within 3 days, please call the pharmacy to schedule your order at [phone]  10/11/2022   Has your insurance changed since your last refill? No   Would you like a pharmacist to call you to discuss your medication(s)? No   Do you require a signature for your package? (Note: if we are billing Medicare Part B or your order contains a controlled substance, we will require a signature) No         Completed refill call assessment today to schedule patient's medication shipment from the [facility] [phone]).  All relevant notes have been reviewed.       Confirmed patient received a Conservation officer, historic buildings and a Surveyor, mining with first shipment. The patient will receive a drug information handout for each medication shipped and additional FDA Medication Guides as required.         REFERRAL TO PHARMACIST     Referral to the pharmacist: Not needed      [facility]     Shipping address confirmed in Epic. Delivery Scheduled: Yes, Expected medication delivery date: 10/11/22.     Medication will be delivered via Same Day Courier to the prescription address in Epic WAM.    [name], PharmD   [facility] Delivery Pharm[facility] Specialty Pharmacist

## 2022-10-11 MED FILL — DUPIXENT 300 MG/2 ML SUBCUTANEOUS PEN INJECTOR: SUBCUTANEOUS | 28 days supply | Qty: 4 | Fill #6

## 2022-10-28 NOTE — Unmapped (Signed)
Patient chart updated.

## 2022-11-09 NOTE — Unmapped (Signed)
[facility] and Home Delivery Pharmacy Refill Coordination Note    Specialty Medication(s) to be Shipped:   Inflammatory Disorders: Dupixent    Other medication(s) to be shipped: No additional medications requested for fill at this time     Dana Jacobs, DOB: 08-26-1980  Phone: [phone] (home)       All above HIPAA information was verified with patient.     Was a Nurse, learning disability used for this call? No    Completed refill call assessment today to schedule patient's medication shipment from the [facility] and [facility] Delivery Pharmacy  [phone]).  All relevant notes have been reviewed.     Specialty medication(s) and dose(s) confirmed: Regimen is correct and unchanged.   Changes to medications: Anisa reports no changes at this time.  Changes to insurance: No  New side effects reported not previously addressed with a pharmacist or physician: None reported  Questions for the pharmacist: No    Confirmed patient received a Conservation officer, historic buildings and a Surveyor, mining with first shipment. The patient will receive a drug information handout for each medication shipped and additional FDA Medication Guides as required.       DISEASE/MEDICATION-SPECIFIC INFORMATION        For patients on injectable medications: Patient currently has 0 doses left.  Next injection is scheduled for 11/5.    SPECIALTY MEDICATION ADHERENCE     Medication Adherence    Patient reported X missed doses in the last month: 0  Specialty Medication: DUPIXENT PEN 300 mg/2 mL              Were doses missed due to medication being on hold? No    DUPIXENT PEN 300 mg/2 mL   : 0 doses of medicine on hand       REFERRAL TO PHARMACIST     Referral to the pharmacist: Not needed      SHIPPING     Shipping address confirmed in Epic.       Delivery Scheduled: Yes, Expected medication delivery date: 10/28.     Medication will be delivered via Same Day Courier to the prescription address in Epic WAM.    Westley Gambles   [facility] and [facility] Delivery Pharmacy  Specialty Technician

## 2022-11-09 NOTE — Unmapped (Signed)
The [facility] has made a second and final attempt to reach this patient to refill the following medication:DUPIXENT PEN 300 mg/2 mL Pnij (dupilumab).      We have left voicemails on the following phone numbers: [phone], have sent a MyChart message, have sent a text message to the following phone numbers: [phone], and have sent a Mychart questionnaire..    Dates contacted: 10/17   10/23  Last scheduled delivery: 10/11/22    The patient may be at risk of non-compliance with this medication. The patient should call the [facility] at [phone]  Option 4, then Option 3: Allergy, Immunology, Pulmonary, Neurology to refill medication.    Lilyth Lawyer' W Nour Rodrigues   [facility] Home Delivery Oncologist

## 2022-11-14 MED FILL — DUPIXENT 300 MG/2 ML SUBCUTANEOUS PEN INJECTOR: SUBCUTANEOUS | 28 days supply | Qty: 4 | Fill #7

## 2023-01-02 NOTE — Unmapped (Signed)
[facility] Delivery Pharm[facility] Refill Coordination Note    Specialty Medication(s) to be Shipped:   CF/Pulmonary/Asthma: Dupixent 300mg     Other medication(s) to be shipped: No additional medications requested for fill at this time     Dana Jacobs, DOB: November 28, 1980  Phone: [phone] (home)       All above HIPAA information was verified with patient.     Was a Nurse, learning disability used for this call? No    Completed refill call assessment today to schedule patient's medication shipment from the [facility] and Home Delivery Pharmacy  [phone]).  All relevant notes have been reviewed.     Specialty medication(s) and dose(s) confirmed: Regimen is correct and unchanged.   Changes to medications: [name] reports no changes at this time.  Changes to insurance: No  New side effects reported not previously addressed with a pharmacist or physician: Yes - Patient reports back pain. Patient would like to speak to the pharmacist today. Their provider is not aware.  Questions for the pharmacist: No    Confirmed patient received a Conservation officer, historic buildings and a Surveyor, mining with first shipment. The patient will receive a drug information handout for each medication shipped and additional FDA Medication Guides as required.       DISEASE/MEDICATION-SPECIFIC INFORMATION        For patients on injectable medications: Patient currently has 1 doses left.  Next injection is scheduled for 12/20.    SPECIALTY MEDICATION ADHERENCE     Medication Adherence    Patient reported X missed doses in the last month: 0  Specialty Medication: DUPIXENT PEN 300 mg/2 mL Pnij (dupilumab)  Patient is on additional specialty medications: No  Patient is on more than two specialty medications: No  Any gaps in refill history greater than 2 weeks in the last 3 months: yes  Demonstrates understanding of importance of adherence: yes  Informant: patient          Were doses missed due to medication being on hold? No    Dupixent 300 mg/64mL : 1 doses of medicine on hand     REFERRAL TO PHARMACIST     Referral to the pharmacist: Yes - routine compliance concerns. Patient has missed 1-3 doses of medication. Refills were scheduled and concern routed to pharmacist for evaluation.      SHIPPING     Shipping address confirmed in Epic.       Delivery Scheduled: Yes, Expected medication delivery date: 01/05/23.     Medication will be delivered via Same Day Courier to the prescription address in Epic WAM.    [name], PharmD   [facility] and Home Delivery Pharmacy  Specialty Pharmacist

## 2023-01-02 NOTE — Unmapped (Signed)
[facility] Clinical Intervention    Type of intervention: Side effect management    Medication involved: Dupixent    Problem identified: [name] reports back pain and is inquiring if this is a side effect of Dupixent. She also states asthma symptoms have worsen with cold weather change.    Intervention performed: We discussed back pain is a reported side effect occurring in ~5% of people taking Dupixent. We discussed taking OTC pain reliever or using topical analgesic like Voltaren gel on back to provide symptom relief. I also encouraged her to make a follow-up pulmonary appt to discuss if alternative treatment should be considered since she has been taking Dupixnet for ~9 months. She verbalized undertanding.     Follow-up needed: 3-4 weeks    Approximate time spent: 5-10 minutes    Clinical evidence used to support intervention: Drug information resource    Result of the intervention: Prevention of an adverse drug event    Dana Jacobs, PharmD   [facility] Special[facility] and Home Delivery Pharmacy Specialty Pharmacist

## 2023-01-05 MED FILL — DUPIXENT 300 MG/2 ML SUBCUTANEOUS PEN INJECTOR: SUBCUTANEOUS | 28 days supply | Qty: 4 | Fill #8

## 2023-01-26 NOTE — Unmapped (Signed)
[facility] Clinical Assessment & Refill Coordination Note    Dana Jacobs, DOB: [birth year]  Phone: [phone] (home)     All above HIPAA information was verified with patient.     Was a Nurse, learning disability used for this call? No    Specialty Medication(s):   CF/Pulmonary/Asthma: Dupixent 300 mg/30mL     Current Outpatient Medications   Medication Sig Dispense Refill    albuterol 2.5 mg /3 mL (0.083 %) nebulizer solution Inhale 3 mL (2.5 mg total) by nebulization every four (4) hours as needed for wheezing. 90 mL 11    azithromycin (ZITHROMAX) 250 MG tablet Take 1 tablet (250 mg total) by mouth daily. (Patient not taking: Reported on 09/15/2022) 90 tablet 3    dupilumab (DUPIXENT PEN) 300 mg/2 mL PnIj Inject the contents of 1 pen (300mg ) under the skin every 14 days as maintenance 4 mL 11    empty container Misc Use as directed to dispose of Dupixent pens 1 each 2    EPINEPHrine (EPIPEN 2-PAK) 0.3 mg/0.3 mL injection Inject 0.3 mL (0.3 mg total) into the muscle as needed for anaphylaxis. 2 each 2    fluticasone-umeclidin-vilanter (TRELEGY ELLIPTA) 200-62.5-25 mcg DsDv Inhale 1 puff daily. 60 each 11    ibuprofen (MOTRIN) 600 MG tablet Take 1 tablet (600 mg total) by mouth every six (6) hours as needed. 60 tablet 11    lidocaine 2% viscous (XYLOCAINE) 2 % Soln 5 mL swished in the mouth and spit out no more frequently than every 3 hours 100 mL 0    montelukast (SINGULAIR) 10 mg tablet Take 1 tablet (10 mg total) by mouth daily. 90 tablet 3    pantoprazole (PROTONIX) 40 MG tablet Take 1 tablet (40 mg total) by mouth two (2) times a day. 120 tablet 2    predniSONE (DELTASONE) 1 MG tablet Take 1 tablet (1 mg total) by mouth daily. (Patient taking differently: Take 1 tablet (1 mg total) by mouth daily. PRN) 90 tablet 11    predniSONE (DELTASONE) 5 MG tablet Take 2 tablets (10 mg total) by mouth daily. (Patient taking differently: Take 2 tablets (10 mg total) by mouth daily. PRN) 120 tablet 5 rosuvastatin (CRESTOR) 20 MG tablet Take 1 tablet (20 mg total) by mouth daily. 90 tablet 3     No current facility-administered medications for this visit.        Changes to medications: Dana Jacobs reports no changes at this time.    No Known Allergies    Changes to allergies: No    SPECIALTY MEDICATION ADHERENCE     Dupixent 300  mg/15mL : 0 doses of medicine on hand       Medication Adherence    Patient reported X missed doses in the last month: 0  Specialty Medication: Dupixent 300 mg/54mL Q14d  Patient is on additional specialty medications: No  Patient is on more than two specialty medications: No  Any gaps in refill history greater than 2 weeks in the last 3 months: no  Demonstrates understanding of importance of adherence: yes  Informant: patient          Specialty medication(s) dose(s) confirmed: Regimen is correct and unchanged.     Are there any concerns with adherence? No    Adherence counseling provided? Not needed    CLINICAL MANAGEMENT AND INTERVENTION      Clinical Benefit Assessment:    Do you feel the medicine is effective or helping your condition? Yes but  still needing rescue inhaler 4x/day    Clinical Benefit counseling provided? consulted provider regarding clinical benefit concerns    Adverse Effects Assessment:    Are you experiencing any side effects? Yes, patient reports experiencing back pain but also has a fracture rib last month. She states pain is likely worsen due to fracture rib. . Side effect counseling provided: She states pain in manageable for now. We discussed I will reach out to provider regarding concerns of clinical benefit.     Are you experiencing difficulty administering your medicine? No    Quality of Life Assessment:    Quality of Life    Rheumatology  Oncology  Dermatology  Cystic Fibrosis          How many days over the past month did your asthma  keep you from your normal activities? For example, brushing your teeth or getting up in the morning. Patient declined to answer    Have you discussed this with your provider? Not needed    Acute Infection Status:    Acute infections noted within Epic:  No active infections  Patient reported infection: None    Therapy Appropriateness:    Is therapy appropriate based on current medication list, adverse reactions, adherence, clinical benefit and progress toward achieving therapeutic goals? Yes, therapy is appropriate and should be continued     DISEASE/MEDICATION-SPECIFIC INFORMATION      For patients on injectable medications: Patient currently has 0 doses left.  Next injection is scheduled for ~1/17.    Asthma and Allergy: Have you had an asthma exacerbation in the last 30 days? No  Have you needed to use your rescue inhaler more often than usual in the last 30 days? Yes, 4x/day.   Have you needed to take steroids for your asthma in the last 30 days? Yes, taking prednisone 5-10mg  daily PRN    PATIENT SPECIFIC NEEDS     Does the patient have any physical, cognitive, or cultural barriers? No    Is the patient high risk? No    Did the patient require a clinical intervention? No    Does the patient require physician intervention or other additional services (i.e., nutrition, smoking cessation, social work)? No    SOCIAL DETERMINANTS OF HEALTH     At the [facility], we have learned that life circumstances - like trouble affording food, housing, utilities, or transportation can affect the health of many of our patients.   That is why we wanted to ask: are you currently experiencing any life circumstances that are negatively impacting your health and/or quality of life? Patient declined to answer    Social Drivers of Health     Food Insecurity: No Food Insecurity (09/08/2022)    Hunger Vital Sign     Worried About Running Out of Food in the Last Year: Never true     Ran Out of Food in the Last Year: Never true   Internet Connectivity: No Internet connectivity concern identified (01/14/2022)    Internet Connectivity     Do you have access to internet services: Yes     How do you connect to the internet: Personal Device at home     Is your internet connection strong enough for you to watch video on your device without major problems?: Yes     Do you have enough data to get through the month?: Yes     Does at least one of the devices have a camera that you can use for  video chat?: Yes   Housing/Utilities: Low Risk  (09/08/2022)    Housing/Utilities     Within the past 12 months, have you ever stayed: outside, in a car, in a tent, in an overnight shelter, or temporarily in someone else's home (i.e. couch-surfing)?: No     Are you worried about losing your housing?: No     Within the past 12 months, have you been unable to get utilities (heat, electricity) when it was really needed?: No   Tobacco Use: Medium Risk (09/16/2022)    Patient History     Smoking Tobacco Use: Former     Smokeless Tobacco Use: Never     Passive Exposure: Not on file   Transportation Needs: No Transportation Needs (09/08/2022)    PRAPARE - Transportation     Lack of Transportation (Medical): No     Lack of Transportation (Non-Medical): No   Alcohol Use: Not At Risk (09/08/2022)    Alcohol Use     How often do you have a drink containing alcohol?: Never     How many drinks containing alcohol do you have on a typical day when you are drinking?: 1 - 2     How often do you have 5 or more drinks on one occasion?: Never   Interpersonal Safety: Not At Risk (01/14/2022)    Interpersonal Safety     Unsafe Where You Currently Live: No     Physically Hurt by Anyone: No     Abused by Anyone: No   Physical Activity: Not on file   Intimate Partner Violence: Not At Risk (01/14/2022)    Humiliation, Afraid, Rape, and Kick questionnaire     Fear of Current or Ex-Partner: No     Emotionally Abused: No     Physically Abused: No     Sexually Abused: No   Stress: No Stress Concern Present (01/14/2022)    [employer] of Occupational Health - Occupational Stress Questionnaire     Feeling of Stress : Only a little   Substance Use: Low Risk  (09/08/2022)    Substance Use     In the past year, how often have you used prescription drugs for non-medical reasons?: Never     In the past year, how often have you used illegal drugs?: Never     In the past year, have you used any substance for non-medical reasons?: No   Social Connections: Not on file   Financial Resource Strain: Medium Risk (01/14/2022)    Overall Financial Resource Strain (CARDIA)     Difficulty of Paying Living Expenses: Somewhat hard   Depression: Not at risk (01/14/2022)    PHQ-2     PHQ-2 Score: 0   Health Literacy: Low Risk  (04/24/2020)    Health Literacy     : Never       Would you be willing to receive help with any of the needs that you have identified today? Not applicable       SHIPPING     Specialty Medication(s) to be Shipped:   CF/Pulmonary/Asthma: Dupixent 300mg     Other medication(s) to be shipped: No additional medications requested for fill at this time     Changes to insurance: No    Delivery Scheduled: Yes, Expected medication delivery date: 02/02/23.     Medication will be delivered via Same Day Courier to the confirmed prescription address in [facility].    The patient will receive a drug information handout for each medication shipped and  additional FDA Medication Guides as required.  Verified that patient has previously received a Conservation officer, historic buildings and a Surveyor, mining.    The patient or caregiver noted above participated in the development of this care plan and knows that they can request review of or adjustments to the care plan at any time.      All of the patient's questions and concerns have been addressed.    [name], PharmD   [facility] Specialty Pharmacist

## 2023-02-02 MED FILL — DUPIXENT 300 MG/2 ML SUBCUTANEOUS PEN INJECTOR: SUBCUTANEOUS | 28 days supply | Qty: 4 | Fill #9

## 2023-02-24 NOTE — Unmapped (Signed)
Dana Jacobs has been contacted in regards to their refill of dupilumab: DUPIXENT PEN 300 mg/2 mL Pnij. At this time, they have declined refill due to patient waiting on therapy change,pt  unclear  is  continuing  therapy   . Refill assessment call date has been updated per the patient's request.

## 2023-03-13 DIAGNOSIS — J4550 Severe persistent asthma, uncomplicated: Principal | ICD-10-CM

## 2023-03-13 DIAGNOSIS — J8281 Chronic eosinophilic pneumonia (CMS-HCC): Principal | ICD-10-CM

## 2023-03-13 DIAGNOSIS — J45901 Unspecified asthma with (acute) exacerbation: Principal | ICD-10-CM

## 2023-03-13 DIAGNOSIS — J4530 Mild persistent asthma, uncomplicated: Principal | ICD-10-CM

## 2023-03-13 MED ORDER — ALBUTEROL SULFATE HFA 90 MCG/ACTUATION AEROSOL INHALER
RESPIRATORY_TRACT | 11 refills | 0.00 days | Status: CP | PRN
Start: 2023-03-13 — End: 2024-03-12

## 2023-03-13 NOTE — Unmapped (Signed)
 Patient left a voicemail stating they are needing a refill on their Albuterol inhaler.  Last appointment on 03/02/2022.  Next appointment on 03/27/2023.  Requested Prescriptions     Pending Prescriptions Disp Refills    albuterol HFA 90 mcg/actuation inhaler 8 g 11     Sig: Inhale 2 puffs every four (4) hours as needed for wheezing.     RX routed to provider for review.

## 2023-03-16 DIAGNOSIS — J4550 Severe persistent asthma, uncomplicated: Principal | ICD-10-CM

## 2023-03-16 MED ORDER — DUPIXENT 300 MG/2 ML SUBCUTANEOUS PEN INJECTOR
SUBCUTANEOUS | 11 refills | 28.00 days
Start: 2023-03-16 — End: ?

## 2023-03-16 NOTE — Unmapped (Signed)
 [facility] has made a second and final attempt to reach this patient to refill the following medication: dupilumab: DUPIXENT PEN 300 mg/2 mL Pnij    We have left voicemails on the following phone numbers: [phone], have sent a text message to the following phone numbers: [phone], and have sent a Mychart questionnaire..    Dates contacted: 03/03/23 and   03/16/23   Last scheduled delivery: 02/02/23     The patient may be at risk of non-compliance with this medication. The patient should call the [facility] at [phone]  Option 4, then Option 3: Allergy, Immunology, Pulmonary, Neurology to refill medication.    Ricci Barker   [facility]

## 2023-03-27 ENCOUNTER — Ambulatory Visit
Admit: 2023-03-27 | Discharge: 2023-03-28 | Payer: BLUE CROSS/BLUE SHIELD | Attending: Student in an Organized Health Care Education/Training Program | Primary: Student in an Organized Health Care Education/Training Program

## 2023-03-27 DIAGNOSIS — J4550 Severe persistent asthma, uncomplicated: Principal | ICD-10-CM

## 2023-03-27 DIAGNOSIS — J8281 Chronic eosinophilic pneumonia (CMS-HCC): Principal | ICD-10-CM

## 2023-03-27 DIAGNOSIS — J45901 Unspecified asthma with (acute) exacerbation: Principal | ICD-10-CM

## 2023-03-27 LAB — CBC W/ AUTO DIFF
BASOPHILS ABSOLUTE COUNT: 0 10*9/L (ref 0.0–0.1)
BASOPHILS RELATIVE PERCENT: 0.3 %
EOSINOPHILS ABSOLUTE COUNT: 0.4 10*9/L (ref 0.0–0.5)
EOSINOPHILS RELATIVE PERCENT: 2.7 %
HEMATOCRIT: 40.6 % (ref 34.0–44.0)
HEMOGLOBIN: 13.8 g/dL (ref 11.3–14.9)
LYMPHOCYTES ABSOLUTE COUNT: 4.4 10*9/L — ABNORMAL HIGH (ref 1.1–3.6)
LYMPHOCYTES RELATIVE PERCENT: 29.7 %
MEAN CORPUSCULAR HEMOGLOBIN CONC: 33.9 g/dL (ref 32.0–36.0)
MEAN CORPUSCULAR HEMOGLOBIN: 33.7 pg — ABNORMAL HIGH (ref 25.9–32.4)
MEAN CORPUSCULAR VOLUME: 99.4 fL — ABNORMAL HIGH (ref 77.6–95.7)
MEAN PLATELET VOLUME: 8.2 fL (ref 6.8–10.7)
MONOCYTES ABSOLUTE COUNT: 0.7 10*9/L (ref 0.3–0.8)
MONOCYTES RELATIVE PERCENT: 5.1 %
NEUTROPHILS ABSOLUTE COUNT: 9.1 10*9/L — ABNORMAL HIGH (ref 1.8–7.8)
NEUTROPHILS RELATIVE PERCENT: 62.2 %
PLATELET COUNT: 259 10*9/L (ref 150–450)
RED BLOOD CELL COUNT: 4.09 10*12/L (ref 3.95–5.13)
RED CELL DISTRIBUTION WIDTH: 13.7 % (ref 12.2–15.2)
WBC ADJUSTED: 14.7 10*9/L — ABNORMAL HIGH (ref 3.6–11.2)

## 2023-03-27 MED ORDER — TRELEGY ELLIPTA 200 MCG-62.5 MCG-25 MCG POWDER FOR INHALATION
Freq: Every day | RESPIRATORY_TRACT | 11 refills | 0 days | Status: CP
Start: 2023-03-27 — End: ?

## 2023-03-27 MED ORDER — AZITHROMYCIN 500 MG TABLET
ORAL_TABLET | ORAL | 3 refills | 70.00 days | Status: CP
Start: 2023-03-27 — End: 2024-03-26

## 2023-03-27 MED ORDER — DUPIXENT 300 MG/2 ML SUBCUTANEOUS PEN INJECTOR
SUBCUTANEOUS | 11 refills | 28.00 days | Status: CP
Start: 2023-03-27 — End: ?

## 2023-03-27 MED ORDER — PREDNISONE 10 MG TABLET
ORAL_TABLET | ORAL | 0 refills | 28.00 days | Status: CP
Start: 2023-03-27 — End: 2023-04-24

## 2023-03-27 MED ORDER — IPRATROPIUM 0.5 MG-ALBUTEROL 3 MG (2.5 MG BASE)/3 ML NEBULIZATION SOLN
Freq: Four times a day (QID) | RESPIRATORY_TRACT | 6 refills | 8.00 days | Status: CP | PRN
Start: 2023-03-27 — End: 2024-03-26

## 2023-03-27 NOTE — Unmapped (Signed)
 It was a pleasure meeting you in clinic today! You were seen in clinic today by Dr. Desiree Hane & Dr. Catalina Lunger      Here's a summary of what we discussed during your visit:  Steroid taper as prescribed  Restart azithromycin  EKG today   Labs today - I will follow up with you via MyChart or phone if labs are abnormal.  Keep up the good work with using your inhalers      Follow up with me in 3 months!    Desiree Hane, MD  Pulmonary & Critical Care Fellow  [facility]  Phone: [phone]  Fax: [phone]    To contact your care team, you can either send a message via MyChart or contact the clinic at [phone].    How do I request medication refills?  Request a refill via [facility] (patient portal), call clinic at [phone] or have your pharmacy fax the request to [phone].    [facility]: [phone] *Pharmacy can mail medications to your home. You must call to request the medication be mailed.[facility]: [phone]  [facility]: [phone]    Here are some things you should know about contacting the [facility]:  Please be advised Epic now releases test results to MyChart as soon as they are available which means you will see your test results before I do.    The best way to reach your doctor for non-urgent matters is through MyChart. I can usually respond within two to three business day but I do not check messages after hours (evenings, weekends, and holidays) and often have other duties (inpatient hospital work, Producer, television/film/video activities, teaching) that make me unavailable.    - If you have sent a MyChart message to the clinic and have not received a response after three business days, please call our clinic at [phone] to speak to a nurse.     If you have an urgent issue that you feel needs a response the same day, you should also contact your primary care provider or be evaluated at an Urgent Care clinic.    For urgent lung issues after hours, you can call the hospital operator [phone]) and ask for the Pulmonary Fellow On Call. This doctor can provide some guidance and will send a message to your regular lung doctor the next morning.    If there is an emergency, call 911 or go to your closest emergency room.    I don't have a MyChart. Why should I get one?   - It's encrypted, so your information is secure  - It's a quick, easy way to contact the care team, manage appointments, see test results, and more!    How do I sign-up for MyChart?   - Download the MyChart app from the [employer] or [employer] and sign-up in the app  - Sign-up online at [email or web address]

## 2023-03-27 NOTE — Unmapped (Signed)
 Pulmonary Clinic - Follow-up Visit      HISTORY:     Active Pulmonary Problems & Brief History:  Dana Jacobs is a 43 y.o. woman with moderate persistent allergic asthma here for follow up of asthma .     Interval History:  - 2019 - diagnosed with asthma, very steroid responsive, had hypereosinophilia, improved with inhalers and nucala  - 09/02/19 - first visit with me - was not on nucala due to insurance lapse. Was having flare, gave steroid burst, 2-3 weeks, with significant improvement in symptoms but rebound upon cessation.   - 11/27/19 - last visit with me - was on advair, montelukast, and mepolizumab (started end of September 2021). Was still not feeling particularly well. Given lack of improvement with 2 months of biologic and inhalers we decided to move forward with cross sectional imaging which revealed peripheral ground-glass opacities. Plan was to proceed with bronchoscopy, but would be too costly for patient so visit set up to discuss options.  - 02/24/20 - breathing not improved. Still with a lot of dyspnea, comes intermittently, even when doing nothing, waking up multiple times per night with shortness of breath. Treated with prolonged steroid taper and TMP-SMX PJP PPX. Thought at that time she had some component of chronic eosinophilic pneumonia based on imaging changes. Planned to treat empirically and see how she was doing in several months.   - 07/09/20 - patient continuing prednisone 30mg  daily since our last visit. Using nucala, advair, montelukast, bactrim MWF. Still has 5 times a day at least when she's still short of breath, wakes up a couple times per night feeling short of breath. Compared to February she is improved, but has been on continuous prednisone. Doesn't feel like nucala doing the trick. No fevers, chills, night sweats, lost 8 pounds despite prednisone. Is doing more exercise, walking, body weight exercises, but is limited by shortness of breath. Advair changed to trelegy. Mepolizumab changed to benralizumab.   - 10/12/20 - feeling a bit better but still using rescue inhaler probably 8 times per day on average. Symptoms include chest tightness, wheezing, coughing. Is having a lot of nasal mucus production but not with coughing. Is using trelegy without difficulty, taking montelukast. Has received 2 doses of fasenra once at time of last visit, 07/09/20, and the second was 09/07/20. Didn't notice any difference with nucala but with fasenra noticed worsened low back pain, increased functional status. Able to exert herself more. Currently taking 5mg  daily of prednisone. Feels like she noticed a difference in symptoms switching from 10mg  daily to 5mg  daily. With dropping dose had increased chest tightness. No acid reflux, still taking pantoprazole twice daily.   - 08/11/21 - is having chest pressure prior to asthma exacerbation when she feels like she can't take a deep breath.  No issues with azithromycin. Trelegy still daily. Singulair daily. Prednisone at 15mg  daily currently. IS walking for exercise - goal of 6000 steps per day. Able to go a mile and half at the track, hasn't timed it. Does notice increased reflux symptoms with high acid foods. Not taking any acid reflux medication  - 03/02/22 - breathing comes and goes. Is doing some walking, probably 45 minutes most days, but is having to stop and catch her breath a lot during the exercise. Is still waking up at night, probably 4 times per week. Using trelegy, montelukast, albuterol (multiple doses per day), prednisone 15mg  daily, azithromycin 250mg  daily. Weight stable between 140-150 a bit heavier than her historical  weight. When she tried dropping her prednisone from 15mg  daily to 10mg  daily she had to go to urgent care due to sharp pain in the chest. Uncertain where we stand with co-pay assistance on a biologic medication. No smoking.  One exacerbation requiring prednisone on 01/22/22, 06/2021, 07/2021.   - 03/27/23 - follow up, first visit with me - has been on dupixent for about a year and stopped in January. Was on a different one before that did not work. She has 1mg  prednisone tablets, takes some of these as an emergency supply. Right now, taking albuterol inhaler. Wants duonebs. Has trelegy but not using it. No seasonal allergies. Had a mold infested house 3-74yrs ago, now in a clean house, no pets. Taking prednisone 1x/wk 10mg . Likes azithromycin but ran out of refills. Fhx - GMA CAD, no hx CF, stopped a few years ago tobacco use. 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      [ ]  touch base with PCP     Past Medical History: The medical and surgical history were personally reviewed and updated in the patient's electronic medical record. Pertinent positives are documented above.    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 No mold in the house that she is aware of, trailer, no mold underneath that she is aware of. No pets.     Other History: The social history and family history were personally reviewed and updated in the patient's electronic medical record. Pertinent positives are documented above.  Home Medications: Medications were reviewed and updated in the patient's electronic medical record. Pertinent positives are documented above.   Allergies: Allergies were reviewed and updated in the patient's electronic medical record. Pertinent positives are documented above.  Review of Systems: A comprehensive review of systems was completed and negative except as noted in HPI.    PHYSICAL EXAM:     There were no vitals filed for this visit.    General: pleasant individual appearing stated age, alert and oriented, no acute distress  HEENT: trachea midline, supple  CV: RRR, no m/r/g  Lungs: CTAB, good air movement  Abd: Soft, NT/ND, no rebound or guarding  Ext: Warm, well perfused, no peripheral edema  Skin: No rashes, skin breakdown, or wounds  Neuro: CN II-XII intact to conversation, alert and oriented.     LABORATORY and RADIOLOGY DATA:     PFT  07/28/21       Moderate obstruction, normal inspiratory flow volume loop, no restriction, significant air trapping, high airway pressures, normal DLCO. Consistent with known asthma.     Pertinent Laboratory Data:   02/26/20  CBC w/ diff - abs eos 0.0  ANCA - negative     11/27/19  Negative - ANCA, ABPA      01/24/19  Negative - HP panel  CBC with diff - 1400 eosinophils (10/25/18 had 800)     10/25/19   IgE - 85     Pertinent Imaging Data:  02/04/20 CT Chest  1. There is no significant imaging evidence for fibrotic interstitial lung disease. Scattered groundglass opacities, predominantly peripheral and greatest in the left lower lobe may represent infection (including COVID-19 pneumonia), or possibly more chronic inflammatory process such  as eosinophilic lung disease.     2.  Geographic steatosis of the liver with a hyperattenuating 1.3 cm focus in segment 4A. The latter may simply reflect a focal area of fatty sparing In absence of known malignancy. If clinically relevant, a hepatic ultrasound or MRI could further characterize.    09/16/22 - DEXA wnl     ASSESSMENT and PLAN     Problem List  Severe Eosinophilic Asthma   Chronic Eosinophilic Pneumonia  GERD    Assessment  Issues with insurance coverage since last visit so have a hard time seeing her.     She was last seen with Dr. Lupita Leash early 2024, started on Dupixent and weaned prendisone slowly. Dupixent she did not feel was helping (still having wheezing) and has since stopped this (~Jan 2025). She appears to have chronic eosinophilic PNA (though imaging findings resolved as of last CT 2024) and severe eosinophilic asthma.       I am suspicious of her feeling that dupixent was not helpful - she was able to get off chronic steroids with its initiation which I would count as a big win. Interestingly she had eos >1000 on dupixent in August but the fact that she was able to get off daily prednisone I think speaks to at least some effect. She has also stopped all inhaled therapy except PRN albuterol but this is more related to insurance coverage. Is using 1mg  prednisone tablets to spot treat wheezing. Has not had any pred in ~5d and still wheezing markedly today.     We will get her back on triple therapy for now, PRN duonebs per her request, start ~61mo steroid taper while we get these things back on board. Will also put azith back on as she found this very helpful.  I also wonder at other causes - ANCA has been negative, as has ABPA panel but we will resend this today as well as a repeat CBCd. No relation to aspirin, denies nasal polyps/sinus sxs, seasonal allergies. She had no diagnosis of asthma or other respiratory issue until 2019-2020 when she moved to [facility]. I wonder at allergies/environmental but moving houses has not changed her sxs and no significant changes if she travels out of town for a period of time. She also does not endorse any other allergic sxs.        Plan  - trelegy, azithromycin, montelukast  - Prednisone taper as above  - Dupixent resumption - consider benralizumab?  - referral to allergy - concern for hypereosinophilic syndromes vs alternate biologic therapy    The patient was seen with Dr. Catalina Lunger and will return to clinic in 3  months.    This visit required the initiation of multiple drugs and monitoring for toxicity (EKG for QTc on azithromycin, resumption of biologic therapy).     Kandis Fantasia, MD  Pulmonary and Critical Care Fellow

## 2023-03-29 LAB — IGE: TOTAL IGE: 10.1 [IU]/mL (ref 2–214)

## 2023-04-03 NOTE — Unmapped (Signed)
 I saw and evaluated the patient, participating in the key portions of the service.  I reviewed the resident???s note.  I agree with the resident???s findings and plan. Jessy Oto, MD

## 2023-05-03 NOTE — Unmapped (Signed)
 PA DUPIXENT  APPROVED

## 2023-06-21 ENCOUNTER — Inpatient Hospital Stay: Admit: 2023-06-21 | Discharge: 2023-06-21 | Payer: BLUE CROSS/BLUE SHIELD

## 2023-06-21 ENCOUNTER — Ambulatory Visit
Admit: 2023-06-21 | Discharge: 2023-06-21 | Payer: BLUE CROSS/BLUE SHIELD | Attending: Student in an Organized Health Care Education/Training Program | Primary: Student in an Organized Health Care Education/Training Program

## 2023-06-21 DIAGNOSIS — J45901 Unspecified asthma with (acute) exacerbation: Principal | ICD-10-CM

## 2023-06-21 DIAGNOSIS — J4550 Severe persistent asthma, uncomplicated: Principal | ICD-10-CM

## 2023-06-21 DIAGNOSIS — J8281 Chronic eosinophilic pneumonia: Principal | ICD-10-CM

## 2023-06-21 LAB — CBC W/ AUTO DIFF
BASOPHILS ABSOLUTE COUNT: 0.1 10*9/L (ref 0.0–0.1)
BASOPHILS RELATIVE PERCENT: 0.7 %
EOSINOPHILS ABSOLUTE COUNT: 0.8 10*9/L — ABNORMAL HIGH (ref 0.0–0.5)
EOSINOPHILS RELATIVE PERCENT: 7.8 %
HEMATOCRIT: 40.3 % (ref 34.0–44.0)
HEMOGLOBIN: 13.8 g/dL (ref 11.3–14.9)
LYMPHOCYTES ABSOLUTE COUNT: 2.1 10*9/L (ref 1.1–3.6)
LYMPHOCYTES RELATIVE PERCENT: 21.8 %
MEAN CORPUSCULAR HEMOGLOBIN CONC: 34.4 g/dL (ref 32.0–36.0)
MEAN CORPUSCULAR HEMOGLOBIN: 33.3 pg — ABNORMAL HIGH (ref 25.9–32.4)
MEAN CORPUSCULAR VOLUME: 96.9 fL — ABNORMAL HIGH (ref 77.6–95.7)
MEAN PLATELET VOLUME: 7.9 fL (ref 6.8–10.7)
MONOCYTES ABSOLUTE COUNT: 0.7 10*9/L (ref 0.3–0.8)
MONOCYTES RELATIVE PERCENT: 6.9 %
NEUTROPHILS ABSOLUTE COUNT: 6.2 10*9/L (ref 1.8–7.8)
NEUTROPHILS RELATIVE PERCENT: 62.8 %
PLATELET COUNT: 233 10*9/L (ref 150–450)
RED BLOOD CELL COUNT: 4.16 10*12/L (ref 3.95–5.13)
RED CELL DISTRIBUTION WIDTH: 13.4 % (ref 12.2–15.2)
WBC ADJUSTED: 9.8 10*9/L (ref 3.6–11.2)

## 2023-06-21 MED ORDER — PREDNISONE 10 MG TABLET
ORAL_TABLET | ORAL | 0 refills | 35.00000 days | Status: CP
Start: 2023-06-21 — End: 2023-07-26
  Filled 2023-06-21: qty 3, 1d supply, fill #0

## 2023-06-21 MED ORDER — PREDNISONE 20 MG TABLET
ORAL_TABLET | Freq: Once | ORAL | 0 refills | 1.00000 days | Status: CP
Start: 2023-06-21 — End: 2023-06-22

## 2023-06-21 MED ORDER — FASENRA 30 MG/ML SUBCUTANEOUS SYRINGE
SUBCUTANEOUS | 0 refills | 84.00000 days | Status: CP
Start: 2023-06-21 — End: 2023-08-17

## 2023-06-21 MED ORDER — IPRATROPIUM 0.5 MG-ALBUTEROL 3 MG (2.5 MG BASE)/3 ML NEBULIZATION SOLN
Freq: Four times a day (QID) | RESPIRATORY_TRACT | 6 refills | 8.00000 days | Status: CP | PRN
Start: 2023-06-21 — End: 2024-06-20

## 2023-06-21 MED ORDER — AZITHROMYCIN 250 MG TABLET
ORAL_TABLET | Freq: Every day | ORAL | 11 refills | 30.00000 days | Status: CP
Start: 2023-06-21 — End: ?

## 2023-06-21 MED ORDER — BUDESONIDE 0.5 MG/2 ML SUSPENSION FOR NEBULIZATION
Freq: Two times a day (BID) | RESPIRATORY_TRACT | 2 refills | 30.00000 days | Status: CP
Start: 2023-06-21 — End: 2024-06-20

## 2023-06-21 MED ADMIN — albuterol 2.5 mg /3 mL (0.083 %) nebulizer solution 2.5 mg: 2.5 mg | RESPIRATORY_TRACT | @ 19:00:00 | Stop: 2023-06-21

## 2023-06-21 NOTE — Unmapped (Signed)
 Pulmonary Clinic - Follow-up Visit      HISTORY:     Active Pulmonary Problems & Brief History:  Chantil Laurina Fischl is a 43 y.o. woman with moderate persistent allergic asthma here for follow up of asthma .     Interval History:  - 2019 - diagnosed with asthma, very steroid responsive, had hypereosinophilia, improved with inhalers and nucala  - 09/02/19 - not on nucala due to insurance lapse. Was having flare, gave steroid burst, 2-3 weeks, with significant improvement in symptoms but rebound upon cessation.   - 11/27/19 - on advair, montelukast, and mepolizumab (started end of September 2021). Was still not feeling particularly well. Given lack of improvement with 2 months of biologic and inhalers we decided to move forward with cross sectional imaging which revealed peripheral ground-glass opacities.   - 02/24/20 - breathing not improved. Still with a lot of dyspnea, comes intermittently, even when doing nothing, waking up multiple times per night with shortness of breath. Treated with prolonged steroid taper and TMP-SMX PJP PPX. Thought at that time she had some component of chronic eosinophilic pneumonia based on imaging changes.   - 07/09/20 - patient continuing prednisone 30mg  daily since our last visit. Using nucala, advair, montelukast, bactrim MWF. Compared to February she is improved, but has been on continuous prednisone. Doesn't feel like nucala doing the trick. Advair changed to trelegy. Mepolizumab changed to benralizumab.   - 10/12/20 - Received 2 doses of fasenra once at time of last visit, 07/09/20, and the second was 09/07/20. Didn't notice any difference with nucala but with fasenra noticed worsened low back pain, increased functional status. Able to exert herself more. Currently taking 5mg  daily of prednisone. Feels like she noticed a difference in symptoms switching from 10mg  daily to 5mg  daily. With dropping dose had increased chest tightness. No acid reflux, still taking pantoprazole twice daily. - 08/11/21 - is having chest pressure prior to asthma exacerbation when she feels like she can't take a deep breath.  No issues with azithromycin. Trelegy still daily. Singulair daily. Prednisone at 15mg  daily currently. IS walking for exercise - goal of 6000 steps per day. Able to go a mile and half at the track, hasn't timed it. Does notice increased reflux symptoms with high acid foods. Not taking any acid reflux medication  - 03/02/22 - breathing comes and goes. Is doing some walking, probably 45 minutes most days, but is having to stop and catch her breath a lot during the exercise. Is still waking up at night, probably 4 times per week. Using trelegy, montelukast, albuterol (multiple doses per day), prednisone 15mg  daily, azithromycin 250mg  daily. Weight stable between 140-150 a bit heavier than her historical weight. When she tried dropping her prednisone from 15mg  daily to 10mg  daily she had to go to urgent care due to sharp pain in the chest. Uncertain where we stand with co-pay assistance on a biologic medication. No smoking.  One exacerbation requiring prednisone on 01/22/22, 06/2021, 07/2021.   - 03/27/23 - follow up, first visit with me - has been on dupixent for about a year and stopped in January. Was on a different one before that did not work. She has 1mg  prednisone tablets, takes some of these as an emergency supply. Right now, taking albuterol inhaler. Wants duonebs. Has trelegy but not using it. No seasonal allergies. Had a mold infested house 3-8yrs ago, now in a clean house, no pets. Taking prednisone 1x/wk 10mg . Likes azithromycin but ran out of refills. Fhx -  GMA CAD, no hx CF, stopped a few years ago tobacco use. 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    - 06/21/23 - Doing very poorly, took prednisone for a month but changed insurance so never got Dupixent. Never got trelegy either ([facility] was unable to fill for unclear reasons). Using rescue inhaler around the clock. Moving to work in [employer] in [employer].     Past Medical History: The medical and surgical history were personally reviewed and updated in the patient's electronic medical record. Pertinent positives are documented above.    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 No mold in the house that she is aware of, trailer, no mold underneath that she is aware of. No pets.     Other History: The social history and family history were personally reviewed and updated in the patient's electronic medical record. Pertinent positives are documented above.  Home Medications: Medications were reviewed and updated in the patient's electronic medical record. Pertinent positives are documented above.   Allergies: Allergies were reviewed and updated in the patient's electronic medical record. Pertinent positives are documented above.  Review of Systems: A comprehensive review of systems was completed and negative except as noted in HPI.    PHYSICAL EXAM:     Vitals:    06/21/23 1357   BP: 152/104   Pulse: 104       General: anxious, tearful with audible wheezing   HEENT: trachea midline, supple  CV: tachycardic   Lungs: diffuse wheezing throughout all lung fields   Ext: Warm, well perfused, no peripheral edema  Skin: No rashes, skin breakdown, or wounds on clothed exam   Neuro: CN II-XII intact to conversation, alert and oriented.     LABORATORY and RADIOLOGY DATA:     PFT  07/28/21           Moderate obstruction, no change with BD, likely air trapping, worse from prior:      Pertinent Laboratory Data:   02/26/20  CBC w/ diff - abs eos 0.0  ANCA - negative     11/27/19  Negative - ANCA, ABPA      01/24/19  Negative - HP panel  CBC with diff - 1400 eosinophils (10/25/18 had 800)     10/25/19   IgE - 85     Pertinent Imaging Data:  02/04/20 CT Chest  1. There is no significant imaging evidence for fibrotic interstitial lung disease. Scattered groundglass opacities, predominantly peripheral and greatest in the left lower lobe may represent infection (including COVID-19 pneumonia), or possibly more chronic inflammatory process such as eosinophilic lung disease.     2.  Geographic steatosis of the liver with a hyperattenuating 1.3 cm focus in segment 4A. The latter may simply reflect a focal area of fatty sparing In absence of known malignancy. If clinically relevant, a hepatic ultrasound or MRI could further characterize.    09/16/22 - DEXA wnl     ASSESSMENT and PLAN     Problem List  Severe Eosinophilic Asthma   Chronic Eosinophilic Pneumonia  GERD    Assessment  At our last visit in March, we had planned to resume Dupixent and started on prednisone taper  for chronic eosinophilic PNA (though imaging findings resolved as of last CT 2024) and severe eosinophilic asthma. She never was notified of its approval so never started back on it; took prednisone and has been relying on rescue inhaler and duonebs.     We will get her back on triple therapy for now, PRN duonebs, again do a  ~29mo steroid taper while we get her on triple therapy, biologic, and azithromycin.     Plan  - she needs to be on triple therapy - have given her paper rx for trelegy so she can fill this where convenient   - in the interim, add budesonide nebs while she works on finding a location to fill trelegy  - EKG today and start azithromycin 250mg  daily    - continue montelukast  - Prednisone taper x 5 weeks (40/30/20/10/5)  - start approval process for fasenra  - attempted to give her steroid IM in clinic but not available- pharmacy has filled 60mg  x1 dose here prednisone for her to take immediately. She was given ER precautions.     The patient was seen with Dr. Anastasia Balo and will return to clinic in 1  months.    This visit required the initiation of multiple drugs and monitoring for toxicity (EKG for QTc on azithromycin, resumption of biologic therapy).     Valda Garnet, MD  Pulmonary and Critical Care Fellow

## 2023-06-21 NOTE — Unmapped (Signed)
 It was a pleasure seeing you in clinic today! You were seen in clinic today by Dr. Lewie Rector & Dr. Anastasia Balo      Here's a summary of what we discussed during your visit:  Start prednisone taper tomorrow  Can take duonebs every 4 hours  Start budesonide nebulizer twice daily  Keep up the good work with taking your medications and using your inhalers      Follow up with me in 4-6 weeks!    Lewie Rector, MD  Pulmonary & Critical Care Fellow  [facility]  Phone: [phone]  Fax: [phone]    To contact your care team, you can either send a message via MyChart or contact the clinic at [phone].    How do I request medication refills?  Request a refill via [facility] (patient portal), call clinic at [phone] or have your pharmacy fax the request to [phone].    [facility]: [phone] *Pharmacy can mail medications to your home. You must call to request the medication be mailed.[facility]: [phone]  [facility]: [phone]    Here are some things you should know about contacting the [facility]:  Please be advised Epic now releases test results to MyChart as soon as they are available which means you will see your test results before I do.    The best way to reach your doctor for non-urgent matters is through MyChart. I can usually respond within two to three business day but I do not check messages after hours (evenings, weekends, and holidays) and often have other duties (inpatient hospital work, Producer, television/film/video activities, teaching) that make me unavailable.    - If you have sent a MyChart message to the clinic and have not received a response after three business days, please call our clinic at [phone] to speak to a nurse.     If you have an urgent issue that you feel needs a response the same day, you should also contact your primary care provider or be evaluated at an Urgent Care clinic.    For urgent lung issues after hours, you can call the hospital operator [phone]) and ask for the Pulmonary Fellow On Call. This doctor can provide some guidance and will send a message to your regular lung doctor the next morning.    If there is an emergency, call 911 or go to your closest emergency room.    I don't have a MyChart. Why should I get one?   - It's encrypted, so your information is secure  - It's a quick, easy way to contact the care team, manage appointments, see test results, and more!    How do I sign-up for MyChart?   - Download the MyChart app from the [employer] or [employer] and sign-up in the app  - Sign-up online at [email or web address]

## 2023-06-22 DIAGNOSIS — J4550 Severe persistent asthma, uncomplicated: Principal | ICD-10-CM

## 2023-06-22 MED ORDER — FASENRA 30 MG/ML SUBCUTANEOUS SYRINGE
SUBCUTANEOUS | 0 refills | 84.00000 days | Status: CP
Start: 2023-06-22 — End: 2023-08-18

## 2023-06-22 NOTE — Unmapped (Signed)
 Addended by: Valda Garnet on: 06/22/2023 04:41 PM     Modules accepted: Orders

## 2023-06-22 NOTE — Unmapped (Signed)
 The [facility] has received the prescription(s) for Fasenra. The triage team has completed the benefits investigation and has determined that the patient is NOT able to fill this medication at the [facility] due to insurance plan limitations. Please see additional information below and re-route the prescription to the preferred pharmacy. Thank you.    PA Required: Yes    Specialty Pharmacy Required:  [facility] - Phone: [phone] and Fax: [phone]    Please note that due to Board of Pharmacy restrictions the authorizing provider must reorder to the pharmacy listed above due to insurance preference

## 2023-06-26 DIAGNOSIS — J4550 Severe persistent asthma, uncomplicated: Principal | ICD-10-CM

## 2023-06-26 NOTE — Unmapped (Signed)
 Good Evening,   Thank you! Yes we have been having some issues with her blood pressure--thank you for keeping me in the loop!

## 2023-07-24 NOTE — Unmapped (Signed)
 Specialty Medication(s): Fasenra     Dana Jacobs has been dis-enrolled from the [facility] specialty pharmacy services as a result of a pharmacy change resulting from insurance limitations. The insurance company requires the patient fill at [facility].    Additional information provided to the patient: n/a    Dana Jacobs, PharmD  [facility] Specialty Pharmacist

## 2023-08-14 ENCOUNTER — Ambulatory Visit
Admit: 2023-08-14 | Discharge: 2023-08-15 | Payer: PRIVATE HEALTH INSURANCE | Attending: Student in an Organized Health Care Education/Training Program | Primary: Student in an Organized Health Care Education/Training Program

## 2023-08-14 DIAGNOSIS — J8281 Chronic eosinophilic pneumonia: Principal | ICD-10-CM

## 2023-08-14 DIAGNOSIS — J4550 Severe persistent asthma, uncomplicated: Principal | ICD-10-CM

## 2023-08-14 MED ORDER — PREDNISONE 10 MG TABLET
ORAL_TABLET | Freq: Every day | ORAL | 0 refills | 14.00000 days | Status: CP
Start: 2023-08-14 — End: 2023-08-28

## 2023-08-14 MED ORDER — TRELEGY ELLIPTA 200 MCG-62.5 MCG-25 MCG POWDER FOR INHALATION
Freq: Every day | RESPIRATORY_TRACT | 11 refills | 60.00000 days | Status: CP
Start: 2023-08-14 — End: ?

## 2023-09-11 DIAGNOSIS — J4550 Severe persistent asthma, uncomplicated: Principal | ICD-10-CM

## 2023-09-11 MED ORDER — ALBUTEROL 90 MCG-BUDESONIDE 80 MCG/ACTUATION HFA AEROSOL INHALER
RESPIRATORY_TRACT | 5 refills | 0.00000 days | Status: CP | PRN
Start: 2023-09-11 — End: ?

## 2023-10-02 DIAGNOSIS — J4550 Severe persistent asthma, uncomplicated: Principal | ICD-10-CM

## 2023-10-02 DIAGNOSIS — E781 Pure hyperglyceridemia: Principal | ICD-10-CM

## 2023-10-02 DIAGNOSIS — E785 Hyperlipidemia, unspecified: Principal | ICD-10-CM

## 2023-10-02 MED ORDER — EPINEPHRINE 0.3 MG/0.3 ML INJECTION, AUTO-INJECTOR
INTRAMUSCULAR | 2 refills | 0.00000 days | PRN
Start: 2023-10-02 — End: ?

## 2023-10-02 MED ORDER — ROSUVASTATIN 20 MG TABLET
ORAL_TABLET | Freq: Every day | ORAL | 3 refills | 90.00000 days
Start: 2023-10-02 — End: 2024-10-01

## 2023-10-02 MED ORDER — PREDNISONE 5 MG TABLET
ORAL_TABLET | ORAL | 0 refills | 28.00000 days
Start: 2023-10-02 — End: 2023-10-30

## 2023-10-02 MED ORDER — IPRATROPIUM 0.5 MG-ALBUTEROL 3 MG (2.5 MG BASE)/3 ML NEBULIZATION SOLN
Freq: Four times a day (QID) | RESPIRATORY_TRACT | 6 refills | 8.00000 days | Status: CP | PRN
Start: 2023-10-02 — End: 2024-10-01

## 2023-10-02 MED ORDER — PANTOPRAZOLE 40 MG TABLET,DELAYED RELEASE
ORAL_TABLET | Freq: Two times a day (BID) | ORAL | 2 refills | 60.00000 days
Start: 2023-10-02 — End: ?

## 2023-10-02 MED ORDER — ALBUTEROL SULFATE HFA 90 MCG/ACTUATION AEROSOL INHALER
RESPIRATORY_TRACT | 11 refills | 0.00000 days | Status: CP | PRN
Start: 2023-10-02 — End: 2024-10-01

## 2023-10-02 MED ORDER — TRELEGY ELLIPTA 200 MCG-62.5 MCG-25 MCG POWDER FOR INHALATION
Freq: Every day | RESPIRATORY_TRACT | 11 refills | 60.00000 days | Status: CP
Start: 2023-10-02 — End: ?

## 2023-10-02 MED ORDER — AZITHROMYCIN 250 MG TABLET
ORAL_TABLET | Freq: Every day | ORAL | 11 refills | 30.00000 days | Status: CP
Start: 2023-10-02 — End: ?

## 2023-10-03 MED ORDER — EPINEPHRINE 0.3 MG/0.3 ML INJECTION, AUTO-INJECTOR
INTRAMUSCULAR | 2 refills | 0.00000 days | Status: CP | PRN
Start: 2023-10-03 — End: ?

## 2023-10-03 MED ORDER — PREDNISONE 5 MG TABLET
ORAL_TABLET | ORAL | 0 refills | 28.00000 days
Start: 2023-10-03 — End: 2023-10-31

## 2023-10-03 MED ORDER — ROSUVASTATIN 20 MG TABLET
ORAL_TABLET | Freq: Every day | ORAL | 3 refills | 90.00000 days | Status: CP
Start: 2023-10-03 — End: 2024-10-02

## 2023-10-03 MED ORDER — PANTOPRAZOLE 40 MG TABLET,DELAYED RELEASE
ORAL_TABLET | Freq: Two times a day (BID) | ORAL | 2 refills | 60.00000 days | Status: CP
Start: 2023-10-03 — End: ?

## 2023-11-22 ENCOUNTER — Ambulatory Visit
Admit: 2023-11-22 | Discharge: 2023-11-23 | Payer: PRIVATE HEALTH INSURANCE | Attending: Student in an Organized Health Care Education/Training Program | Primary: Student in an Organized Health Care Education/Training Program

## 2023-11-22 DIAGNOSIS — Z0000 Encounter for general adult medical examination without abnormal findings: Principal | ICD-10-CM

## 2023-11-22 DIAGNOSIS — J4550 Severe persistent asthma, uncomplicated: Principal | ICD-10-CM

## 2023-11-22 MED ORDER — FASENRA PEN 30 MG/ML SUBCUTANEOUS AUTO-INJECTOR
SUBCUTANEOUS | 6 refills | 0.00000 days | Status: CP
Start: 2023-11-22 — End: ?

## 2023-11-22 MED ORDER — PREDNISONE 10 MG TABLET
ORAL_TABLET | ORAL | 0 refills | 15.00000 days | Status: CP
Start: 2023-11-22 — End: 2023-12-07

## 2023-11-22 MED ORDER — BUDESONIDE 0.5 MG/2 ML SUSPENSION FOR NEBULIZATION
Freq: Two times a day (BID) | RESPIRATORY_TRACT | 11 refills | 30.00000 days | Status: CP
Start: 2023-11-22 — End: 2024-11-21

## 2024-01-16 DIAGNOSIS — J4550 Severe persistent asthma, uncomplicated: Principal | ICD-10-CM

## 2024-01-16 MED ORDER — BUDESONIDE 1 MG/2 ML SUSPENSION FOR NEBULIZATION
RESPIRATORY_TRACT | 4 refills | 0.00000 days | Status: CP
Start: 2024-01-16 — End: ?

## 2024-01-18 MED ORDER — PANTOPRAZOLE 40 MG TABLET,DELAYED RELEASE
ORAL_TABLET | Freq: Two times a day (BID) | ORAL | 2 refills | 0.00000 days
Start: 2024-01-18 — End: ?

## 2024-01-19 MED ORDER — PANTOPRAZOLE 40 MG TABLET,DELAYED RELEASE
ORAL_TABLET | Freq: Two times a day (BID) | ORAL | 2 refills | 90.00000 days
Start: 2024-01-19 — End: ?

## 2024-01-26 MED ORDER — PREDNISONE 5 MG TABLET
ORAL_TABLET | 0 refills | 0.00000 days
Start: 2024-01-26 — End: ?

## 2024-02-01 MED ORDER — PREDNISONE 5 MG TABLET
ORAL_TABLET | 0 refills | 0.00000 days
Start: 2024-02-01 — End: ?
